# Patient Record
Sex: Male | Born: 1940 | Race: White | Hispanic: No | Marital: Married | Smoking: Former smoker
Health system: Southern US, Community
[De-identification: ages and names within clinical notes are randomized; demographics above are authoritative.]

## PROBLEM LIST (undated history)

## (undated) DIAGNOSIS — Z9981 Dependence on supplemental oxygen: Secondary | ICD-10-CM

## (undated) DIAGNOSIS — D040 Carcinoma in situ of skin of lip: Secondary | ICD-10-CM

## (undated) DIAGNOSIS — M21942 Unspecified acquired deformity of hand, left hand: Secondary | ICD-10-CM

## (undated) DIAGNOSIS — M109 Gout, unspecified: Secondary | ICD-10-CM

## (undated) DIAGNOSIS — J449 Chronic obstructive pulmonary disease, unspecified: Secondary | ICD-10-CM

## (undated) DIAGNOSIS — I10 Essential (primary) hypertension: Secondary | ICD-10-CM

## (undated) DIAGNOSIS — K219 Gastro-esophageal reflux disease without esophagitis: Secondary | ICD-10-CM

## (undated) DIAGNOSIS — E785 Hyperlipidemia, unspecified: Secondary | ICD-10-CM

## (undated) DIAGNOSIS — N400 Enlarged prostate without lower urinary tract symptoms: Secondary | ICD-10-CM

## (undated) DIAGNOSIS — C4492 Squamous cell carcinoma of skin, unspecified: Secondary | ICD-10-CM

## (undated) DIAGNOSIS — M1990 Unspecified osteoarthritis, unspecified site: Secondary | ICD-10-CM

## (undated) DIAGNOSIS — C439 Malignant melanoma of skin, unspecified: Secondary | ICD-10-CM

## (undated) DIAGNOSIS — N529 Male erectile dysfunction, unspecified: Secondary | ICD-10-CM

## (undated) DIAGNOSIS — H8109 Meniere's disease, unspecified ear: Secondary | ICD-10-CM

## (undated) DIAGNOSIS — D229 Melanocytic nevi, unspecified: Secondary | ICD-10-CM

## (undated) HISTORY — DX: Male erectile dysfunction, unspecified: N52.9

## (undated) HISTORY — PX: THUMB AMPUTATION: SHX804

## (undated) HISTORY — PX: TRANSURETHRAL RESECTION OF PROSTATE: SHX73

## (undated) HISTORY — DX: Meniere's disease, unspecified ear: H81.09

## (undated) HISTORY — DX: Unspecified osteoarthritis, unspecified site: M19.90

## (undated) HISTORY — DX: Unspecified acquired deformity of hand, left hand: M21.942

## (undated) HISTORY — PX: TONSILLECTOMY: SUR1361

## (undated) HISTORY — DX: Benign prostatic hyperplasia without lower urinary tract symptoms: N40.0

## (undated) HISTORY — DX: Gastro-esophageal reflux disease without esophagitis: K21.9

## (undated) HISTORY — DX: Hyperlipidemia, unspecified: E78.5

## (undated) HISTORY — DX: Chronic obstructive pulmonary disease, unspecified: J44.9

## (undated) HISTORY — PX: OTHER SURGICAL HISTORY: SHX169

## (undated) HISTORY — DX: Essential (primary) hypertension: I10

---
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
## 1998-03-31 DIAGNOSIS — D040 Carcinoma in situ of skin of lip: Secondary | ICD-10-CM

## 1998-03-31 HISTORY — DX: Carcinoma in situ of skin of lip: D04.0

## 2001-01-16 DIAGNOSIS — C4492 Squamous cell carcinoma of skin, unspecified: Secondary | ICD-10-CM

## 2001-01-16 HISTORY — DX: Squamous cell carcinoma of skin, unspecified: C44.92

## 2001-05-18 ENCOUNTER — Encounter: Admission: RE | Admit: 2001-05-18 | Discharge: 2001-05-18 | Payer: Self-pay | Admitting: Family Medicine

## 2001-05-18 ENCOUNTER — Encounter: Payer: Self-pay | Admitting: Family Medicine

## 2002-03-04 ENCOUNTER — Encounter: Payer: Self-pay | Admitting: Urology

## 2002-03-13 ENCOUNTER — Inpatient Hospital Stay (HOSPITAL_COMMUNITY): Admission: RE | Admit: 2002-03-13 | Discharge: 2002-03-15 | Payer: Self-pay | Admitting: Urology

## 2002-03-13 ENCOUNTER — Encounter: Payer: Self-pay | Admitting: Urology

## 2002-03-13 ENCOUNTER — Encounter (INDEPENDENT_AMBULATORY_CARE_PROVIDER_SITE_OTHER): Payer: Self-pay | Admitting: Specialist

## 2004-04-26 ENCOUNTER — Ambulatory Visit: Payer: Self-pay | Admitting: Family Medicine

## 2004-05-04 ENCOUNTER — Ambulatory Visit: Payer: Self-pay | Admitting: Family Medicine

## 2005-02-22 ENCOUNTER — Ambulatory Visit: Payer: Self-pay | Admitting: Family Medicine

## 2005-02-28 ENCOUNTER — Ambulatory Visit: Payer: Self-pay | Admitting: Internal Medicine

## 2005-02-28 ENCOUNTER — Encounter: Admission: RE | Admit: 2005-02-28 | Discharge: 2005-02-28 | Payer: Self-pay | Admitting: Family Medicine

## 2005-03-18 ENCOUNTER — Ambulatory Visit: Payer: Self-pay | Admitting: Internal Medicine

## 2005-03-24 ENCOUNTER — Ambulatory Visit: Payer: Self-pay

## 2005-04-18 ENCOUNTER — Ambulatory Visit (HOSPITAL_COMMUNITY): Admission: RE | Admit: 2005-04-18 | Discharge: 2005-04-18 | Payer: Self-pay | Admitting: Internal Medicine

## 2005-04-18 ENCOUNTER — Ambulatory Visit: Payer: Self-pay | Admitting: Internal Medicine

## 2005-04-28 ENCOUNTER — Ambulatory Visit: Payer: Self-pay | Admitting: Family Medicine

## 2005-05-06 ENCOUNTER — Ambulatory Visit: Payer: Self-pay | Admitting: Internal Medicine

## 2005-05-10 ENCOUNTER — Ambulatory Visit: Payer: Self-pay | Admitting: Family Medicine

## 2005-05-12 ENCOUNTER — Ambulatory Visit: Payer: Self-pay | Admitting: *Deleted

## 2005-05-18 ENCOUNTER — Ambulatory Visit: Payer: Self-pay

## 2005-05-18 ENCOUNTER — Encounter: Payer: Self-pay | Admitting: Cardiovascular Disease

## 2005-05-26 ENCOUNTER — Ambulatory Visit: Payer: Self-pay | Admitting: Emergency Medicine

## 2005-05-27 ENCOUNTER — Ambulatory Visit: Payer: Self-pay | Admitting: Emergency Medicine

## 2005-07-04 ENCOUNTER — Ambulatory Visit: Payer: Self-pay | Admitting: Emergency Medicine

## 2005-07-12 ENCOUNTER — Ambulatory Visit: Payer: Self-pay | Admitting: Family Medicine

## 2005-07-18 ENCOUNTER — Ambulatory Visit: Payer: Self-pay | Admitting: Gastroenterology

## 2005-07-28 ENCOUNTER — Ambulatory Visit (HOSPITAL_COMMUNITY): Admission: RE | Admit: 2005-07-28 | Discharge: 2005-07-28 | Payer: Self-pay | Admitting: Gastroenterology

## 2005-07-28 ENCOUNTER — Encounter (INDEPENDENT_AMBULATORY_CARE_PROVIDER_SITE_OTHER): Payer: Self-pay | Admitting: *Deleted

## 2005-07-28 ENCOUNTER — Ambulatory Visit: Payer: Self-pay | Admitting: Gastroenterology

## 2005-08-08 ENCOUNTER — Ambulatory Visit: Payer: Self-pay | Admitting: Family Medicine

## 2005-11-28 ENCOUNTER — Ambulatory Visit: Payer: Self-pay | Admitting: Emergency Medicine

## 2006-02-15 ENCOUNTER — Ambulatory Visit: Payer: Self-pay | Admitting: Emergency Medicine

## 2006-03-13 ENCOUNTER — Ambulatory Visit: Payer: Self-pay | Admitting: Family Medicine

## 2006-04-10 ENCOUNTER — Ambulatory Visit: Payer: Self-pay | Admitting: Emergency Medicine

## 2006-04-21 ENCOUNTER — Ambulatory Visit: Payer: Self-pay | Admitting: Family Medicine

## 2006-04-21 LAB — CONVERTED CEMR LAB
Albumin: 3.8 g/dL (ref 3.5–5.2)
Alkaline Phosphatase: 73 units/L (ref 39–117)
Basophils Absolute: 0.2 10*3/uL — ABNORMAL HIGH (ref 0.0–0.1)
CO2: 24 meq/L (ref 19–32)
Chol/HDL Ratio, serum: 5
Creatinine, Ser: 1.3 mg/dL (ref 0.4–1.5)
Glomerular Filtration Rate, Af Am: 71 mL/min/{1.73_m2}
Glucose, Bld: 108 mg/dL — ABNORMAL HIGH (ref 70–99)
Ketones, ur: NEGATIVE mg/dL
LDL DIRECT: 143.2 mg/dL
MCHC: 33.4 g/dL (ref 30.0–36.0)
Monocytes Relative: 5.9 % (ref 3.0–11.0)
Neutro Abs: 4.8 10*3/uL (ref 1.4–7.7)
Nitrite: NEGATIVE
Platelets: 287 10*3/uL (ref 150–400)
Potassium: 3.5 meq/L (ref 3.5–5.1)
RDW: 12.8 % (ref 11.5–14.6)
Specific Gravity, Urine: 1.01 (ref 1.000–1.03)
TSH: 1.21 microintl units/mL (ref 0.35–5.50)
Total Bilirubin: 1.1 mg/dL (ref 0.3–1.2)
Total Protein, Urine: NEGATIVE mg/dL
Total Protein: 6.8 g/dL (ref 6.0–8.3)
Urobilinogen, UA: 0.2 (ref 0.0–1.0)
VLDL: 38 mg/dL (ref 0–40)
pH: 5.5 (ref 5.0–8.0)

## 2006-05-02 ENCOUNTER — Ambulatory Visit: Payer: Self-pay | Admitting: Family Medicine

## 2006-06-07 ENCOUNTER — Ambulatory Visit: Payer: Self-pay | Admitting: Emergency Medicine

## 2006-12-08 ENCOUNTER — Ambulatory Visit: Payer: Self-pay | Admitting: Family Medicine

## 2006-12-08 DIAGNOSIS — N401 Enlarged prostate with lower urinary tract symptoms: Secondary | ICD-10-CM

## 2006-12-08 DIAGNOSIS — R351 Nocturia: Secondary | ICD-10-CM

## 2006-12-08 DIAGNOSIS — M1990 Unspecified osteoarthritis, unspecified site: Secondary | ICD-10-CM | POA: Insufficient documentation

## 2006-12-08 DIAGNOSIS — M79609 Pain in unspecified limb: Secondary | ICD-10-CM | POA: Insufficient documentation

## 2006-12-08 DIAGNOSIS — M66329 Spontaneous rupture of flexor tendons, unspecified upper arm: Secondary | ICD-10-CM

## 2006-12-08 DIAGNOSIS — J449 Chronic obstructive pulmonary disease, unspecified: Secondary | ICD-10-CM | POA: Insufficient documentation

## 2006-12-08 DIAGNOSIS — J309 Allergic rhinitis, unspecified: Secondary | ICD-10-CM

## 2006-12-08 DIAGNOSIS — I10 Essential (primary) hypertension: Secondary | ICD-10-CM

## 2006-12-08 DIAGNOSIS — K219 Gastro-esophageal reflux disease without esophagitis: Secondary | ICD-10-CM

## 2006-12-08 DIAGNOSIS — E785 Hyperlipidemia, unspecified: Secondary | ICD-10-CM

## 2006-12-28 ENCOUNTER — Ambulatory Visit: Payer: Self-pay | Admitting: Family Medicine

## 2006-12-28 ENCOUNTER — Telehealth (INDEPENDENT_AMBULATORY_CARE_PROVIDER_SITE_OTHER): Payer: Self-pay | Admitting: *Deleted

## 2006-12-28 LAB — CONVERTED CEMR LAB
CO2: 26 meq/L (ref 19–32)
Calcium: 9.3 mg/dL (ref 8.4–10.5)
Chloride: 104 meq/L (ref 96–112)
GFR calc non Af Amer: 71 mL/min
Glucose, Bld: 101 mg/dL — ABNORMAL HIGH (ref 70–99)
Sodium: 139 meq/L (ref 135–145)

## 2007-05-29 ENCOUNTER — Telehealth (INDEPENDENT_AMBULATORY_CARE_PROVIDER_SITE_OTHER): Payer: Self-pay | Admitting: *Deleted

## 2007-05-30 ENCOUNTER — Ambulatory Visit: Payer: Self-pay | Admitting: Family Medicine

## 2007-05-30 DIAGNOSIS — N529 Male erectile dysfunction, unspecified: Secondary | ICD-10-CM

## 2007-05-30 DIAGNOSIS — Z8739 Personal history of other diseases of the musculoskeletal system and connective tissue: Secondary | ICD-10-CM

## 2007-05-30 LAB — CONVERTED CEMR LAB
Albumin: 4 g/dL (ref 3.5–5.2)
Basophils Absolute: 0 10*3/uL (ref 0.0–0.1)
Bilirubin Urine: NEGATIVE
Bilirubin, Direct: 0.1 mg/dL (ref 0.0–0.3)
Chloride: 98 meq/L (ref 96–112)
Cholesterol: 190 mg/dL (ref 0–200)
Direct LDL: 121.9 mg/dL
Eosinophils Absolute: 0.3 10*3/uL (ref 0.0–0.6)
Eosinophils Relative: 2.9 % (ref 0.0–5.0)
GFR calc Af Amer: 71 mL/min
GFR calc non Af Amer: 59 mL/min
Glucose, Bld: 83 mg/dL (ref 70–99)
HDL: 36.3 mg/dL — ABNORMAL LOW (ref >39.0)
Hemoglobin: 15.6 g/dL (ref 13.0–17.0)
Ketones, urine, test strip: NEGATIVE
MCHC: 34.9 g/dL (ref 30.0–36.0)
Monocytes Absolute: 0.7 10*3/uL (ref 0.2–0.7)
Neutro Abs: 6.2 10*3/uL (ref 1.4–7.7)
Neutrophils Relative %: 67.2 % (ref 43.0–77.0)
Nitrite: NEGATIVE
PSA: 0.62 ng/mL (ref 0.10–4.00)
Potassium: 4 meq/L (ref 3.5–5.1)
Protein, U semiquant: NEGATIVE
RBC: 5.15 M/uL (ref 4.22–5.81)
Sodium: 137 meq/L (ref 135–145)
Total CHOL/HDL Ratio: 5.2
Urobilinogen, UA: NEGATIVE
VLDL: 42 mg/dL — ABNORMAL HIGH (ref 0–40)
WBC: 9.2 10*3/uL (ref 4.5–10.5)

## 2007-06-04 ENCOUNTER — Telehealth: Payer: Self-pay | Admitting: Family Medicine

## 2007-06-14 ENCOUNTER — Ambulatory Visit: Payer: Self-pay | Admitting: Emergency Medicine

## 2007-06-15 ENCOUNTER — Telehealth: Payer: Self-pay | Admitting: Family Medicine

## 2007-06-22 ENCOUNTER — Telehealth: Payer: Self-pay | Admitting: Family Medicine

## 2007-07-17 ENCOUNTER — Ambulatory Visit: Payer: Self-pay | Admitting: Family Medicine

## 2007-07-29 ENCOUNTER — Encounter: Payer: Self-pay | Admitting: Gastroenterology

## 2007-09-18 ENCOUNTER — Ambulatory Visit: Payer: Self-pay | Admitting: Family Medicine

## 2007-09-18 DIAGNOSIS — R05 Cough: Secondary | ICD-10-CM

## 2007-09-25 ENCOUNTER — Ambulatory Visit: Payer: Self-pay | Admitting: Emergency Medicine

## 2007-10-29 ENCOUNTER — Telehealth: Payer: Self-pay | Admitting: *Deleted

## 2007-11-05 ENCOUNTER — Telehealth (INDEPENDENT_AMBULATORY_CARE_PROVIDER_SITE_OTHER): Payer: Self-pay | Admitting: *Deleted

## 2007-12-03 ENCOUNTER — Ambulatory Visit: Payer: Self-pay | Admitting: Gastroenterology

## 2007-12-19 ENCOUNTER — Ambulatory Visit (HOSPITAL_COMMUNITY): Admission: RE | Admit: 2007-12-19 | Discharge: 2007-12-19 | Payer: Self-pay | Admitting: Gastroenterology

## 2007-12-19 ENCOUNTER — Telehealth (INDEPENDENT_AMBULATORY_CARE_PROVIDER_SITE_OTHER): Payer: Self-pay | Admitting: *Deleted

## 2007-12-20 ENCOUNTER — Encounter: Payer: Self-pay | Admitting: Gastroenterology

## 2007-12-20 ENCOUNTER — Ambulatory Visit (HOSPITAL_COMMUNITY): Admission: RE | Admit: 2007-12-20 | Discharge: 2007-12-20 | Payer: Self-pay | Admitting: Gastroenterology

## 2007-12-21 ENCOUNTER — Encounter: Payer: Self-pay | Admitting: Gastroenterology

## 2007-12-26 ENCOUNTER — Ambulatory Visit: Payer: Self-pay | Admitting: Gastroenterology

## 2008-03-17 ENCOUNTER — Telehealth: Payer: Self-pay | Admitting: Gastroenterology

## 2008-03-17 ENCOUNTER — Encounter: Payer: Self-pay | Admitting: Gastroenterology

## 2008-05-19 ENCOUNTER — Encounter: Payer: Self-pay | Admitting: Family Medicine

## 2008-05-19 ENCOUNTER — Encounter: Admission: RE | Admit: 2008-05-19 | Discharge: 2008-08-17 | Payer: Self-pay | Admitting: Family Medicine

## 2008-06-13 ENCOUNTER — Ambulatory Visit: Payer: Self-pay | Admitting: Family Medicine

## 2008-06-13 LAB — CONVERTED CEMR LAB
ALT: 19 units/L (ref 0–53)
AST: 37 units/L (ref 0–37)
Albumin: 4 g/dL (ref 3.5–5.2)
BUN: 24 mg/dL — ABNORMAL HIGH (ref 6–23)
Basophils Absolute: 0 10*3/uL (ref 0.0–0.1)
Basophils Relative: 0.1 % (ref 0.0–3.0)
Bilirubin Urine: NEGATIVE
CO2: 27 meq/L (ref 19–32)
Calcium: 9.2 mg/dL (ref 8.4–10.5)
Chloride: 102 meq/L (ref 96–112)
Cholesterol: 180 mg/dL (ref 0–200)
Creatinine, Ser: 1.3 mg/dL (ref 0.4–1.5)
Eosinophils Relative: 2.6 % (ref 0.0–5.0)
Glucose, Urine, Semiquant: NEGATIVE
Hemoglobin: 16.9 g/dL (ref 13.0–17.0)
LDL Cholesterol: 118 mg/dL — ABNORMAL HIGH (ref 0–99)
Lymphocytes Relative: 22.8 % (ref 12.0–46.0)
MCHC: 34.6 g/dL (ref 30.0–36.0)
MCV: 90.2 fL (ref 78.0–100.0)
Neutro Abs: 5.6 10*3/uL (ref 1.4–7.7)
Neutrophils Relative %: 66.3 % (ref 43.0–77.0)
PSA: 0.46 ng/mL (ref 0.10–4.00)
Protein, U semiquant: NEGATIVE
RBC: 5.41 M/uL (ref 4.22–5.81)
Total Protein: 7.1 g/dL (ref 6.0–8.3)
Uric Acid, Serum: 6.7 mg/dL (ref 4.0–7.8)
VLDL: 19 mg/dL (ref 0–40)
WBC Urine, dipstick: NEGATIVE
WBC: 8.4 10*3/uL (ref 4.5–10.5)
pH: 7

## 2008-07-02 ENCOUNTER — Telehealth: Payer: Self-pay | Admitting: Gastroenterology

## 2008-07-22 ENCOUNTER — Telehealth: Payer: Self-pay | Admitting: Family Medicine

## 2008-08-05 ENCOUNTER — Ambulatory Visit: Payer: Self-pay | Admitting: Gastroenterology

## 2008-09-16 ENCOUNTER — Ambulatory Visit: Payer: Self-pay | Admitting: Gastroenterology

## 2009-01-12 ENCOUNTER — Telehealth (INDEPENDENT_AMBULATORY_CARE_PROVIDER_SITE_OTHER): Payer: Self-pay | Admitting: *Deleted

## 2009-03-11 ENCOUNTER — Ambulatory Visit: Payer: Self-pay | Admitting: Emergency Medicine

## 2009-03-13 ENCOUNTER — Telehealth: Payer: Self-pay | Admitting: Adult Health

## 2009-03-18 ENCOUNTER — Encounter: Payer: Self-pay | Admitting: Gastroenterology

## 2009-03-19 ENCOUNTER — Telehealth: Payer: Self-pay | Admitting: Gastroenterology

## 2009-03-19 ENCOUNTER — Ambulatory Visit: Payer: Self-pay | Admitting: Family Medicine

## 2009-03-19 DIAGNOSIS — M545 Low back pain: Secondary | ICD-10-CM

## 2009-03-19 LAB — CONVERTED CEMR LAB
Nitrite: NEGATIVE
Protein, U semiquant: NEGATIVE
Urobilinogen, UA: 0.2
WBC Urine, dipstick: NEGATIVE

## 2009-03-20 ENCOUNTER — Telehealth: Payer: Self-pay | Admitting: Family Medicine

## 2009-04-13 ENCOUNTER — Ambulatory Visit: Payer: Self-pay | Admitting: Emergency Medicine

## 2009-04-16 ENCOUNTER — Telehealth: Payer: Self-pay | Admitting: Emergency Medicine

## 2009-04-30 ENCOUNTER — Encounter: Payer: Self-pay | Admitting: Emergency Medicine

## 2009-06-08 ENCOUNTER — Ambulatory Visit: Payer: Self-pay | Admitting: Family Medicine

## 2009-06-08 LAB — CONVERTED CEMR LAB
Bilirubin Urine: NEGATIVE
Blood in Urine, dipstick: NEGATIVE
Glucose, Urine, Semiquant: NEGATIVE
Ketones, urine, test strip: NEGATIVE
Nitrite: NEGATIVE
Protein, U semiquant: NEGATIVE
Specific Gravity, Urine: 1.015
Urobilinogen, UA: 0.2
WBC Urine, dipstick: NEGATIVE
pH: 7

## 2009-06-10 LAB — CONVERTED CEMR LAB
AST: 39 units/L — ABNORMAL HIGH (ref 0–37)
Alkaline Phosphatase: 74 units/L (ref 39–117)
BUN: 22 mg/dL (ref 6–23)
Basophils Absolute: 0 10*3/uL (ref 0.0–0.1)
Calcium: 9.1 mg/dL (ref 8.4–10.5)
GFR calc non Af Amer: 58.41 mL/min (ref >60)
Glucose, Bld: 111 mg/dL — ABNORMAL HIGH (ref 70–99)
HDL: 43.9 mg/dL (ref >39.00)
Hemoglobin: 15.3 g/dL (ref 13.0–17.0)
LDL Cholesterol: 99 mg/dL (ref 0–99)
Lymphocytes Relative: 22.9 % (ref 12.0–46.0)
Monocytes Relative: 8 % (ref 3.0–12.0)
Platelets: 214 10*3/uL (ref 150.0–400.0)
RDW: 12.7 % (ref 11.5–14.6)
Sodium: 141 meq/L (ref 135–145)
Testosterone: 499.63 ng/dL (ref 350.00–890.00)
Total Bilirubin: 1.2 mg/dL (ref 0.3–1.2)
VLDL: 25 mg/dL (ref 0.0–40.0)
WBC: 5.8 10*3/uL (ref 4.5–10.5)

## 2009-06-29 ENCOUNTER — Ambulatory Visit: Payer: Self-pay | Admitting: Emergency Medicine

## 2010-02-09 ENCOUNTER — Encounter: Payer: Self-pay | Admitting: Family Medicine

## 2010-03-29 ENCOUNTER — Encounter
Admission: RE | Admit: 2010-03-29 | Discharge: 2010-04-26 | Payer: Self-pay | Source: Home / Self Care | Attending: Podiatry | Admitting: Podiatry

## 2010-04-30 ENCOUNTER — Ambulatory Visit: Payer: Self-pay | Admitting: Cardiovascular Disease

## 2010-05-07 ENCOUNTER — Encounter: Payer: Self-pay | Admitting: Cardiovascular Disease

## 2010-05-07 DIAGNOSIS — R079 Chest pain, unspecified: Secondary | ICD-10-CM | POA: Insufficient documentation

## 2010-05-19 ENCOUNTER — Ambulatory Visit
Admission: RE | Admit: 2010-05-19 | Discharge: 2010-05-19 | Payer: Self-pay | Source: Home / Self Care | Attending: Sports Medicine | Admitting: Sports Medicine

## 2010-05-19 DIAGNOSIS — M7660 Achilles tendinitis, unspecified leg: Secondary | ICD-10-CM | POA: Insufficient documentation

## 2010-06-10 ENCOUNTER — Other Ambulatory Visit: Payer: Self-pay | Admitting: Family Medicine

## 2010-06-10 ENCOUNTER — Ambulatory Visit
Admission: RE | Admit: 2010-06-10 | Discharge: 2010-06-10 | Payer: Self-pay | Source: Home / Self Care | Attending: Family Medicine | Admitting: Family Medicine

## 2010-06-10 LAB — CBC WITH DIFFERENTIAL/PLATELET
Basophils Relative: 0.5 % (ref 0.0–3.0)
Eosinophils Relative: 3.3 % (ref 0.0–5.0)
MCV: 91.1 fl (ref 78.0–100.0)
Monocytes Absolute: 0.5 10*3/uL (ref 0.1–1.0)
Neutrophils Relative %: 65.4 % (ref 43.0–77.0)
RBC: 5.26 Mil/uL (ref 4.22–5.81)
WBC: 7.2 10*3/uL (ref 4.5–10.5)

## 2010-06-10 LAB — HEPATIC FUNCTION PANEL
ALT: 16 U/L (ref 0–53)
AST: 32 U/L (ref 0–37)
Alkaline Phosphatase: 81 U/L (ref 39–117)
Bilirubin, Direct: 0.1 mg/dL (ref 0.0–0.3)
Total Protein: 6.6 g/dL (ref 6.0–8.3)

## 2010-06-10 LAB — CONVERTED CEMR LAB
Ketones, urine, test strip: NEGATIVE
Nitrite: NEGATIVE
Urobilinogen, UA: 0.2

## 2010-06-10 LAB — LIPID PANEL
LDL Cholesterol: 125 mg/dL — ABNORMAL HIGH (ref 0–99)
Total CHOL/HDL Ratio: 5
Triglycerides: 157 mg/dL — ABNORMAL HIGH (ref 0.0–149.0)

## 2010-06-10 LAB — PSA: PSA: 0.5 ng/mL (ref 0.10–4.00)

## 2010-06-10 LAB — BASIC METABOLIC PANEL
Chloride: 101 mEq/L (ref 96–112)
Creatinine, Ser: 1.3 mg/dL (ref 0.4–1.5)
Potassium: 4.1 mEq/L (ref 3.5–5.1)

## 2010-06-15 NOTE — Assessment & Plan Note (Signed)
Summary: emp-will fast//ccm/dr office rescd per pt//ccm   Vital Signs:  Patient profile:   70 year old male Height:      70 inches Weight:      196 pounds Temp:     98.5 degrees F oral BP sitting:   110 / 76  (left arm) Cuff size:   regular  Vitals Entered By: Kern Reap CMA Duncan Dull) (June 08, 2009 9:01 AM)  Reason for Visit cpx  Primary Care Provider:  Kelle Darting, MD   History of Present Illness: [name] is a 70 year old, married male, retired Ph.D., who comes in today for evaluation of multiple problems.  He has underlying hypertension, for which he takes Tenormin, 50 mg daily, Maxzide 75 -- 50 daily, Norvasc 7.5 mg daily, and was started 25 mg daily, and potassium 20 mEq daily.  BP 110/76.  He also has chronic back pain for which he takes Flexeril, 10 mg nightly  He also has hyperlipidemia, for which he takes Lipitor 40 mg nightly Will check lipid panel today.  He has a history of gout, for which he takes allopurinol 150 mg nightly.  No attacks this year.  He also has severe osteoarthritis, for which he takes Relafen, 500 mg b.i.d.  He also has underlying COPD, for which he takes Spiriva one puff daily prior p.r.n.  He also has erectile dysfunction.  He's tried all the medications none seem to work.  We will check a testosterone level today.  He also has reflux esophagitis, for which he takes 40 mg of Protonix b.i.d.  He also has mild sleep dysfunction, for which he takes Elavil 25 mg nightly.  He takes over-the-counter saw palmetto for BPH.  He also uses Flonase nasal spray, and over-the-counter Zyrtec for allergic rhinitis.    Allergies: 1)  ! Codeine  Past History:  Past medical, surgical, family and social histories (including risk factors) reviewed, and no changes noted (except as noted below).  Past Medical History: Reviewed history from 05/30/2007 and no changes required. GERD Hyperlipidemia Hypertension Allergic rhinitis Benign prostatic  hypertrophy COPD Osteoarthritis-DJD [name] BPH double collecting system renal right inguinal hernia repair tonsillectomy.  Deformity, left hand, secondary to prior drug abuse  Past Surgical History: Reviewed history from 12/08/2006 and no changes required. Transurethral resection of prostate  Family History: Reviewed history from 05/30/2007 and no changes required. father retired, M.D., in [place], [place], had gallbladder disease, and colon polyps.  Mother died of an MI.  She had arthritis, hypertension, smoker, and peripheral vascular disease.  One brother in good health two sisters one has arthritis.  Social History: Reviewed history from 03/11/2009 and no changes required. 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  Quit in 2007. Alcohol use-no Drug use-no Regular exercise-yes  Review of Systems      See HPI  Physical Exam  General:  Well-developed,well-nourished,in no acute distress; alert,appropriate and cooperative throughout examination Head:  Normocephalic and atraumatic without obvious abnormalities. No apparent alopecia or balding. Eyes:  No corneal or conjunctival inflammation noted. EOMI. Perrla. Funduscopic exam benign, without hemorrhages, exudates or papilledema. Vision grossly normal. Ears:  External ear exam shows no significant lesions or deformities.  Otoscopic examination reveals clear canals, tympanic membranes are intact bilaterally without bulging, retraction, inflammation or discharge. Hearing is grossly normal bilaterally. Nose:  External nasal examination shows no deformity or inflammation. Nasal mucosa are pink and moist without lesions or exudates. Mouth:  Oral  mucosa and oropharynx without lesions or exudates.  Teeth in good repair. Neck:  No deformities, masses, or tenderness noted. Chest Wall:  No deformities, masses, tenderness or gynecomastia noted. Breasts:   No masses or gynecomastia noted Lungs:  symmetrical breath sounds decreased, consistent with COPD Heart:  Normal rate and regular rhythm. S1 and S2 normal without gallop, murmur, click, rub or other extra sounds........Marland Kitchenheart sounds distant, consistent with COPD Abdomen:  Bowel sounds positive,abdomen soft and non-tender without masses, organomegaly or hernias noted. Rectal:  No external abnormalities noted. Normal sphincter tone. No rectal masses or tenderness. Genitalia:  Testes bilaterally descended without nodularity, tenderness or masses. No scrotal masses or lesions. No penis lesions or urethral discharge. Prostate:  Prostate gland firm and smooth, no enlargement, nodularity, tenderness, mass, asymmetry or induration. 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   Neurologic:  No cranial nerve deficits noted. Station and gait are normal. Plantar reflexes are down-going bilaterally. DTRs are symmetrical throughout. Sensory, motor and coordinative functions appear intact. Skin:  Intact without suspicious lesions or rashes.........onychomycosis of toenails Cervical Nodes:  No lymphadenopathy noted Axillary Nodes:  No palpable lymphadenopathy Inguinal Nodes:  No significant adenopathy Psych:  Cognition and judgment appear intact. Alert and cooperative with normal attention span and concentration. No apparent delusions, illusions, hallucinations   Impression & Recommendations:  Problem # 1:  ERECTILE DYSFUNCTION, ORGANIC (ICD-607.84) Assessment Deteriorated  His updated medication list for this problem includes:    Levitra 20 Mg Tabs (Vardenafil hcl) ..... Uad  Orders: Venipuncture (60454) TLB-Lipid Panel (80061-LIPID) TLB-BMP (Basic Metabolic Panel-BMET)  (80048-METABOL) TLB-CBC Platelet - w/Differential (85025-CBCD) TLB-Hepatic/Liver Function Pnl (80076-HEPATIC) TLB-TSH (Thyroid Stimulating Hormone) (84443-TSH) TLB-Uric Acid, Blood (84550-URIC) TLB-PSA (Prostate Specific Antigen) (84153-PSA) TLB-Testosterone, Total (84403-TESTO)  Problem # 2:  GOUTY TOPHI OF OTHER SITES (ICD-274.82) Assessment: Unchanged  His updated medication list for this problem includes:    Allopurinol 300 Mg Tabs (Allopurinol) .Marland Kitchen... 1/2 tab once daily    Colchicine 0.6 Mg Tabs (Colchicine) .Marland Kitchen... 1 q 6h. as needed gout  Orders: Venipuncture (09811) TLB-Lipid Panel (80061-LIPID) TLB-BMP (Basic Metabolic Panel-BMET) (80048-METABOL) TLB-CBC Platelet - w/Differential (85025-CBCD) TLB-Hepatic/Liver Function Pnl (80076-HEPATIC) TLB-TSH (Thyroid Stimulating Hormone) (84443-TSH) TLB-Uric Acid, Blood (84550-URIC) TLB-PSA (Prostate Specific Antigen) (84153-PSA) TLB-Testosterone, Total (84403-TESTO)  Problem # 3:  OSTEOARTHRITIS (ICD-715.90) Assessment: Unchanged  Orders: Venipuncture (91478) TLB-Lipid Panel (80061-LIPID) TLB-BMP (Basic Metabolic Panel-BMET) (80048-METABOL) TLB-CBC Platelet - w/Differential (85025-CBCD) TLB-Hepatic/Liver Function Pnl (80076-HEPATIC) TLB-TSH (Thyroid Stimulating Hormone) (84443-TSH) TLB-Uric Acid, Blood (84550-URIC) TLB-PSA (Prostate Specific Antigen) (84153-PSA) TLB-Testosterone, Total (84403-TESTO)  Problem # 4:  COPD (ICD-496) Assessment: Unchanged  His updated medication list for this problem includes:    Proair Hfa 108 (90 Base) Mcg/act Aers (Albuterol sulfate) .Marland Kitchen... Take one to two puffs as needed    Spiriva Handihaler 18 Mcg Caps (Tiotropium bromide monohydrate) ..... Use once daily  Orders: Venipuncture (29562) TLB-Lipid Panel (80061-LIPID) TLB-BMP (Basic Metabolic Panel-BMET) (80048-METABOL) TLB-CBC Platelet - w/Differential (85025-CBCD) TLB-Hepatic/Liver Function Pnl (80076-HEPATIC) TLB-TSH (Thyroid Stimulating  Hormone) (84443-TSH) TLB-Uric Acid, Blood (84550-URIC) TLB-PSA (Prostate Specific Antigen) (84153-PSA) TLB-Testosterone, Total (84403-TESTO)  Problem # 5:  HYPERTENSION (ICD-401.9) Assessment: Improved  The following medications were removed from the medication list:    Norvasc 5 Mg Tabs (Amlodipine besylate) .Marland Kitchen... 1 tab once daily His updated medication list for this  problem includes:    Tenormin 50 Mg Tabs (Atenolol) ..... Qam    Losartan Potassium 25 Mg Tabs (Losartan potassium) .Marland Kitchen... 1 by mouth once daily    Maxzide 75-50 Mg Tabs (Triamterene-hctz) ..... Qam    Norvasc 5 Mg Tabs (Amlodipine besylate) .Marland Kitchen... Take one and half tab once daily  Orders: Venipuncture (42353) TLB-Lipid Panel (80061-LIPID) TLB-BMP (Basic Metabolic Panel-BMET) (80048-METABOL) TLB-CBC Platelet - w/Differential (85025-CBCD) TLB-Hepatic/Liver Function Pnl (80076-HEPATIC) TLB-TSH (Thyroid Stimulating Hormone) (84443-TSH) TLB-Uric Acid, Blood (84550-URIC) TLB-PSA (Prostate Specific Antigen) (84153-PSA) TLB-Testosterone, Total (84403-TESTO) Prescription Created Electronically [phone]) UA Dipstick w/o Micro (automated)  (81003)  Problem # 6:  HYPERLIPIDEMIA (ICD-272.4) Assessment: Improved  His updated medication list for this problem includes:    Lipitor 40 Mg Tabs (Atorvastatin calcium) .Marland Kitchen... 1 tab @ bedtime  Orders: Venipuncture (15400) TLB-Lipid Panel (80061-LIPID) TLB-BMP (Basic Metabolic Panel-BMET) (80048-METABOL) TLB-CBC Platelet - w/Differential (85025-CBCD) TLB-Hepatic/Liver Function Pnl (80076-HEPATIC) TLB-TSH (Thyroid Stimulating Hormone) (84443-TSH) TLB-Uric Acid, Blood (84550-URIC) TLB-PSA (Prostate Specific Antigen) (84153-PSA) TLB-Testosterone, Total (84403-TESTO)  Problem # 7:  GERD (ICD-530.81) Assessment: Improved  His updated medication list for this problem includes:    Protonix 40 Mg Tbec (Pantoprazole sodium) .Marland Kitchen... 1 pill twice daily (20-30 min prior to breakfast and dinner  meals)  Orders: Venipuncture (86761) TLB-Lipid Panel (80061-LIPID) TLB-BMP (Basic Metabolic Panel-BMET) (80048-METABOL) TLB-CBC Platelet - w/Differential (85025-CBCD) TLB-Hepatic/Liver Function Pnl (80076-HEPATIC) TLB-TSH (Thyroid Stimulating Hormone) (84443-TSH) TLB-Uric Acid, Blood (84550-URIC) TLB-PSA (Prostate Specific Antigen) (84153-PSA) TLB-Testosterone, Total (84403-TESTO)  Complete Medication List: 1)  Nizoral 200 Mg Tabs (Ketoconazole) .... As needed 2)  Tenormin 50 Mg Tabs (Atenolol) .... Qam 3)  Losartan Potassium 25 Mg Tabs (Losartan potassium) .Marland Kitchen.. 1 by mouth once daily 4)  Maxzide 75-50 Mg Tabs (Triamterene-hctz) .... Qam 5)  Flexeril 10 Mg Tabs (Cyclobenzaprine hcl) .... Qhs 6)  Relafen 500  .... Take 1 tablet by mouth two times a day 7)  Flonase 50 Mcg/act Susp (Fluticasone propionate) .... 2 puffs qd 8)  Zyrtec Allergy 10 Mg Tabs (Cetirizine hcl) .... Once daily 9)  Allopurinol 300 Mg Tabs (Allopurinol) .... 1/2 tab once daily 10)  Proair Hfa 108 (90 Base) Mcg/act Aers (Albuterol sulfate) .... Take one to two puffs as needed 11)  Colchicine 0.6 Mg Tabs (Colchicine) .Marland Kitchen.. 1 q 6h. as needed gout 12)  Lipitor 40 Mg Tabs (Atorvastatin calcium) .Marland Kitchen.. 1 tab @ bedtime 13)  Protonix 40 Mg Tbec (Pantoprazole sodium) .Marland Kitchen.. 1 pill twice daily (20-30 min prior to breakfast and dinner meals) 14)  Saw Palmetto 1000 Mg Caps (Saw palmetto (serenoa repens)) .... One tab two times a day 15)  Glucosamine-chondroitin 500-400 Mg Caps (Glucosamine-chondroitin) .... Take one tab three times a day 16)  Levitra 20 Mg Tabs (Vardenafil hcl) .... Uad 17)  Spiriva Handihaler 18 Mcg Caps (Tiotropium bromide monohydrate) .... Use once daily 18)  Potassium Chloride Crys Cr 20 Meq Cr-tabs (Potassium chloride crys cr) .... Take 1 tablet by mouth every morning 19)  Amitriptyline Hcl 25 Mg Tabs (Amitriptyline hcl) .... 1/2 tab at bedtime for sleep 20)  Norvasc 5 Mg Tabs (Amlodipine besylate) .... Take  one and half tab once daily  Patient Instructions: 1)  Please schedule a follow-up appointment in 1 year. Prescriptions: FLEXERIL 10 MG  TABS (CYCLOBENZAPRINE HCL) QHS  #100 x 4   Entered and Authorized by:   Roderick Pee MD   Signed by:   Roderick Pee MD on 06/08/2009   Method used:   Print then Give to Patient  RxID:   [id number] NORVASC 5 MG TABS (AMLODIPINE BESYLATE) take one and half tab once daily  #150 x 3   Entered and Authorized by:   Roderick Pee MD   Signed by:   Roderick Pee MD on 06/08/2009   Method used:   Electronically to        [facility]. [address] [phone]* (retail)       [address] [place], [place]  [address]       Ph: [phone]       Fax: [phone]   RxID:   [id number] AMITRIPTYLINE HCL 25 MG TABS (AMITRIPTYLINE HCL) 1/2 tab at bedtime for sleep  #100 x 4   Entered and Authorized by:   Roderick Pee MD   Signed by:   Roderick Pee MD on 06/08/2009   Method used:   Electronically to        [facility]. [address] [phone]* (retail)       [address] [place], [place]  [address]       Ph: [phone]       Fax: [phone]   RxID:   [id number] POTASSIUM CHLORIDE CRYS CR 20 MEQ CR-TABS (POTASSIUM CHLORIDE CRYS CR) Take 1 tablet by mouth every morning  #100 x 4   Entered and Authorized by:   Roderick Pee MD   Signed by:   Roderick Pee MD on 06/08/2009   Method used:   Electronically to        [facility]. [address] [phone]* (retail)       [address] [place], [place]  [address]       Ph: [phone]       Fax: [phone]   RxID:   [id number] SPIRIVA HANDIHALER 18 MCG CAPS (TIOTROPIUM BROMIDE MONOHYDRATE) use once daily  #3 x 4   Entered and Authorized by:   Roderick Pee MD   Signed by:   Roderick Pee MD on 06/08/2009   Method used:   Electronically to        [facility]. [address] [phone]* (retail)       [address] [place], [place]  [address]       Ph: [phone]        Fax: [phone]   RxID:   [id number] LEVITRA 20 MG TABS (VARDENAFIL HCL) UAD  #6 x 11   Entered and Authorized by:   Roderick Pee MD   Signed by:   Roderick Pee MD on 06/08/2009   Method used:   Electronically to        [facility]. [address] [phone]* (retail)       [address] [place], [place]  [address]       Ph: [phone]       Fax: [phone]   RxID:   [id number] PROTONIX 40 MG  TBEC (PANTOPRAZOLE SODIUM) 1 pill twice daily (20-30 min prior to breakfast and dinner meals)  #200 x 4   Entered and Authorized by:   Roderick Pee MD   Signed by:   Roderick Pee MD on 06/08/2009   Method used:   Electronically to        [facility]. [employer] [phone]* (retail)  [address] [place], [place]  [address]       Ph: [phone]       Fax: [phone]   RxID:   [id number] LIPITOR 40 MG  TABS (ATORVASTATIN CALCIUM) 1 tab @ bedtime  #100 x 4   Entered and Authorized by:   Roderick Pee MD   Signed by:   Roderick Pee MD on 06/08/2009   Method used:   Electronically to        [facility]. [address] [phone]* (retail)       [address] [place], [place]  [address]       Ph: [phone]       Fax: [phone]   RxID:   [id number] COLCHICINE 0.6 MG  TABS (COLCHICINE) 1 q 6h. as needed gout  #30 x 2   Entered and Authorized by:   Roderick Pee MD   Signed by:   Roderick Pee MD on 06/08/2009   Method used:   Electronically to        [facility]. [address] [phone]* (retail)       [address] [place], [place]  [address]       Ph: [phone]       Fax: [phone]   RxID:   [id number] PROAIR HFA 108 (90 BASE) MCG/ACT  AERS (ALBUTEROL SULFATE) take one to two puffs as needed  #2 x 1   Entered and Authorized by:   Roderick Pee MD   Signed by:   Roderick Pee MD on 06/08/2009   Method used:   Electronically to        [facility]. [address] [phone]* (retail)       [address] [place], [place]  [address]       Ph: [phone]       Fax: [phone]   RxID:   [id number] ALLOPURINOL 300 MG  TABS (ALLOPURINOL) 1/2 tab once daily  #100 x 4   Entered and Authorized by:   Roderick Pee MD   Signed by:   Roderick Pee MD on 06/08/2009   Method used:   Electronically to        [facility]. [address] [phone]* (retail)       [address] [place], [place]  [address]       Ph: [phone]       Fax: [phone]   RxID:   [id number] FLONASE 50 MCG/ACT  SUSP (FLUTICASONE PROPIONATE) 2 PUFFS QD  #3 x 4   Entered and Authorized by:   Roderick Pee MD   Signed by:   Roderick Pee MD on 06/08/2009   Method used:   Electronically to        [facility]. [address] [phone]* (retail)       [address] [place], [place]  [address]       Ph: [phone]       Fax: [phone]   RxID:   [id number] RELAFEN 500 Take 1 tablet by mouth two times a day  #200 x 4   Entered and Authorized by:   Roderick Pee MD   Signed by:   Roderick Pee MD on 06/08/2009   Method used:  Print then Give to Patient   RxID:   [id number] MAXZIDE 75-50 MG  TABS (TRIAMTERENE-HCTZ) QAM  #100 x 4   Entered and Authorized by:   Roderick Pee MD   Signed by:   Roderick Pee MD on 06/08/2009   Method used:   Electronically to        [facility]. [address] [phone]* (retail)       [address] [place], [place]  [address]       Ph: [phone]       Fax: [phone]   RxID:   [id number] LOSARTAN POTASSIUM 25 MG TABS (LOSARTAN POTASSIUM) 1 by mouth once daily  #100 x 4   Entered and Authorized by:   Roderick Pee MD   Signed by:   Roderick Pee MD on 06/08/2009   Method used:   Electronically to        [facility]. [address] [phone]* (retail)       [address] [place], [place]  [address]       Ph: [phone]       Fax: [phone]   RxID:   [id number] TENORMIN 50 MG  TABS (ATENOLOL) QAM  #100 x 4   Entered and Authorized  by:   Roderick Pee MD   Signed by:   Roderick Pee MD on 06/08/2009   Method used:   Electronically to        [facility]. [address] [phone]* (retail)       [address] [place], [place]  [address]       Ph: [phone]       Fax: [phone]   RxID:   [id number]     Laboratory Results   Urine Tests    Routine Urinalysis   Color: yellow Appearance: Clear Glucose: negative   (Normal Range: Negative) Bilirubin: negative   (Normal Range: Negative) Ketone: negative   (Normal Range: Negative) Spec. Gravity: 1.015   (Normal Range: 1.003-1.035) Blood: negative   (Normal Range: Negative) pH: 7.0   (Normal Range: 5.0-8.0) Protein: negative   (Normal Range: Negative) Urobilinogen: 0.2   (Normal Range: 0-1) Nitrite: negative   (Normal Range: Negative) Leukocyte Esterace: negative   (Normal Range: Negative)    Comments: Rita Ohara  June 08, 2009 1:35 PM

## 2010-06-15 NOTE — Medication Information (Signed)
Summary: [facility]  [facility]   Imported By: Lester Holland Patent 07/09/2009 08:38:15  _____________________________________________________________________  External Attachment:    Type:   Image     Comment:   External Document

## 2010-06-15 NOTE — Assessment & Plan Note (Signed)
Summary: COPD   Visit Type:  Follow-up Primary Provider/Referring Provider:  Kelle Darting, MD  CC:  3 mo follow-up for COPD. The patient states his breathing is no better or worse. He does c/o a productive sounding cough but is unable to get any mucus up. He also c/o some sinus pain and pressure x1 week.Marland Kitchen  History of Present Illness: 70 yo man with COPD, allergic rhinitis, GERD.    March 11, 2009--Presents for an Acute Visit.  Pt c/o chest congestion, wheezing, dry cough, inc SOB .Symptoms started 1 month ago, now worse this past week. Minimally productive cough, has several coughing fits. Wheezing is getting worse. gerd worse over last week. Severe cough at times. Denies chest pain,  orthopnea, hemoptysis, fever, n/v/d, edema, headache.   ROV 04/13/09 -- was seen a month ago with cough, was changed from altase to benicar. Adjusted by Dr Tawanna Cooler - now on Benicar 5mg  + norvasc 5mg . Was given prednisone taper at that visit too, but didn't take it because it usually gives him side effects. He is still able to exert, but has noticed a difference in his overall fxn.   ROV 06/29/09 -- regular f/u for COPD. Has had some URI signs last several days. Having cough, scant sputum. Some nasal congestion. Not really manifesting itself as a change in his breathing, wheeze. Has been using mucinex. Overall breathing has been stable, he doesn't feels that he needs any addition to his maintanance regimen.   Current Medications (verified): 1)  Nizoral 200 Mg Tabs (Ketoconazole) .... As Needed 2)  Tenormin 50 Mg  Tabs (Atenolol) .... Qam 3)  Losartan Potassium 25 Mg Tabs (Losartan Potassium) .Marland Kitchen.. 1 By Mouth Once Daily 4)  Maxzide 75-50 Mg  Tabs (Triamterene-Hctz) .... Qam 5)  Flexeril 10 Mg  Tabs (Cyclobenzaprine Hcl) .... Qhs 6)  Relafen 500 .... Take 1 Tablet By Mouth Two Times A Day 7)  Flonase 50 Mcg/act  Susp (Fluticasone Propionate) .... 2 Puffs Qd 8)  Zyrtec Allergy 10 Mg Tabs (Cetirizine Hcl) .... Once  Daily 9)  Allopurinol 300 Mg  Tabs (Allopurinol) .... 1/2 Tab Once Daily 10)  Proair Hfa 108 (90 Base) Mcg/act  Aers (Albuterol Sulfate) .... Take One To Two Puffs As Needed 11)  Colchicine 0.6 Mg  Tabs (Colchicine) .Marland Kitchen.. 1 Q 6h. As Needed Gout 12)  Lipitor 40 Mg  Tabs (Atorvastatin Calcium) .Marland Kitchen.. 1 Tab @ Bedtime 13)  Protonix 40 Mg  Tbec (Pantoprazole Sodium) .Marland Kitchen.. 1 Pill Twice Daily (20-30 Min Prior To Breakfast and Dinner Meals) 14)  Saw Palmetto 1000 Mg Caps (Saw Palmetto (Serenoa Repens)) .... One Tab Two Times A Day 15)  Glucosamine-Chondroitin 500-400 Mg Caps (Glucosamine-Chondroitin) .... Take One Tab Three Times A Day 16)  Levitra 20 Mg Tabs (Vardenafil Hcl) .... Uad 17)  Spiriva Handihaler 18 Mcg Caps (Tiotropium Bromide Monohydrate) .... Use Once Daily 18)  Potassium Chloride Crys Cr 20 Meq Cr-Tabs (Potassium Chloride Crys Cr) .... Take 1 Tablet By Mouth Every Morning 19)  Amitriptyline Hcl 25 Mg Tabs (Amitriptyline Hcl) .... 1/2 Tab At Bedtime For Sleep 20)  Norvasc 5 Mg Tabs (Amlodipine Besylate) .... Take One and Half Tab Once Daily  Allergies (verified): 1)  ! Codeine  Vital Signs:  Patient profile:   70 year old male Height:      70 inches (177.80 cm) Weight:      195.38 pounds (88.81 kg) BMI:     28.14 O2 Sat:      94 %  on Room air Temp:     97.7 degrees F (36.50 degrees C) oral Pulse rate:   74 / minute BP sitting:   118 / 84  (left arm) Cuff size:   regular  Vitals Entered By: Michel Bickers CMA (June 29, 2009 8:50 AM)  O2 Sat at Rest %:  94 O2 Flow:  Room air  Physical Exam  Additional Exam:  GEN: A/Ox3; pleasant , NAD HEENT:  [place]/AT, , EACs-clear, TMs-wnl, NOSE-clear, THROAT-clear NECK:  Supple w/ fair ROM; no JVD; normal carotid impulses w/o bruits; no thyromegaly or nodules palpated; no lymphadenopathy. RESP  distant no wheeze CARD:  RRR, no m/r/g   GI:   Not examined [place]: Warm bil,  no calf tenderness edema, clubbing, pulses intact Neuro: intact w/ no  focal deficits    Impression & Recommendations:  Problem # 1:  COPD (ICD-496) Continue spiriva + SABA rov 4 mo reviewed signs of exacerbation with him, he will call if symptoms change  Other Orders: Est. Patient Level III (21308)  Patient Instructions: 1)  Continue your Spiriva as you are using it.  2)  Use ProAir as needed  3)  Try to use OTC decongestants that contain either chlorphenerimine or bromphenerimine.  4)  Call our office if you develop fevers, more cough, change in sputum production or color, more wheze or shortness of breath.  5)  Follow up with Dr Delton Coombes in 4 months or as needed

## 2010-06-15 NOTE — Progress Notes (Signed)
Summary: PROBS W- MEDS   Phone Note Call from Patient Call back at Home Phone [phone]   Caller: Patient Call For: [name]  Reason for Call: Talk to Nurse, Referral Details for Reason: PROBS MEDS Summary of Call: pt believes that Protonix is not working too well anymore Initial call taken by: Guadlupe Spanish [facility],  July 02, 2008 9:20 AM  Follow-up for Phone Call        Taking protonix in am and it works well until around supper then starts getting reflux and has to take Gaviscon frequently.Some reflux at night. Follow-up by: Teryl Lucy RN,  July 02, 2008 9:37 AM  Additional Follow-up for Phone Call Additional follow up Details #1::        instead of gaviscon, I would like him to take OTC pepcid with his dinner meal.  Should have rov with me in 4-5 weeks. Additional Follow-up by: Rachael Fee MD,  July 02, 2008 10:28 AM    Additional Follow-up for Phone Call Additional follow up Details #2::    Pt. ntfd. of Dr.Jyllian Haynie orders and given rov appt. 08/05/08 at 8:30 Follow-up by: Teryl Lucy RN,  July 02, 2008 11:01 AM

## 2010-06-15 NOTE — Miscellaneous (Signed)
Summary: flu vaccine   Clinical Lists Changes  Observations: Added new observation of FLU VAX: Historical (12/08/2009 11:37)      Immunization History:  Influenza Immunization History:    Influenza:  historical (12/08/2009)

## 2010-06-16 ENCOUNTER — Encounter: Payer: Self-pay | Admitting: Sports Medicine

## 2010-06-16 ENCOUNTER — Ambulatory Visit: Admit: 2010-06-16 | Payer: Self-pay | Admitting: Sports Medicine

## 2010-06-16 ENCOUNTER — Ambulatory Visit: Payer: Medicare Other | Admitting: Sports Medicine

## 2010-06-16 DIAGNOSIS — M7660 Achilles tendinitis, unspecified leg: Secondary | ICD-10-CM

## 2010-06-17 NOTE — Assessment & Plan Note (Signed)
Summary: CPX (PT WILL COME IN FASTING) // RS   Vital Signs:  Patient profile:   70 year old male Height:      70 inches Weight:      196 pounds Temp:     98.0 degrees F oral BP sitting:   130 / 80  (left arm) Cuff size:   regular  Vitals Entered By: Kern Reap CMA Duncan Dull) (June 10, 2010 8:24 AM) CC: wllness exam Is Patient Diabetic? No   Primary Care Provider:  Kelle Darting, MD  CC:  wllness exam.  History of Present Illness: ed is a 70 year old, married male, nonsmoker, who comes in today for Medicare wellness examination because of a history of hypertension, hyperlipidemia, gout, COPD, reflux esophagitis.  Erectile dysfunction sleep dysfunction.  All his medications were reviewed in detail.  There been no changes except recently, the orthopedist has given him nitroglycerin glycerin patches for tendinitis of his heel.  Review of systems negative except he had a cardiac evaluation by one of our cardiologists in [place].  Because of chest pain.  His chest pain is a dull pain that comes and goes.  It is not associated with exertion.  It occurs in the left upper chest wall area and is relieved by Motrin.  No cardiac or pulmonary symptoms.  He gets routine eye care, dental care, colonoscopy, 2007, tetanus, 2007, seasonal flu 2011, Pneumovax 2006, information given on shingles.  Allergies: 1)  ! Codeine  Past History:  Past medical, surgical, family and social histories (including risk factors) reviewed, and no changes noted (except as noted below).  Past Medical History: Reviewed history from 05/30/2007 and no changes required. GERD Hyperlipidemia Hypertension Allergic rhinitis Benign prostatic hypertrophy COPD Osteoarthritis-DJD ED BPH double collecting system renal right inguinal hernia repair tonsillectomy.  Deformity, left hand, secondary to prior drug abuse  Past Surgical History: Reviewed history from 12/08/2006 and no changes required. Transurethral  resection of prostate  Family History: Reviewed history from 05/30/2007 and no changes required. father retired, M.D., in [place], [place], had gallbladder disease, and colon polyps.  Mother died of an MI.  She had arthritis, hypertension, smoker, and peripheral vascular disease.  One brother in good health two sisters one has arthritis.  Social History: Reviewed history from 03/11/2009 and no changes required. 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  Quit in 2007. Alcohol use-no Drug use-no Regular exercise-yes  Review of Systems      See HPI  Physical Exam  General:  Well-developed,well-nourished,in no acute distress; alert,appropriate and cooperative throughout examination Head:  Normocephalic and atraumatic without obvious abnormalities. No apparent alopecia or balding. Eyes:  No corneal or conjunctival inflammation noted. EOMI. Perrla. Funduscopic exam benign, without hemorrhages, exudates or papilledema. Vision grossly normal. Ears:  External ear exam shows no significant lesions or deformities.  Otoscopic examination reveals clear canals, tympanic membranes are intact bilaterally without bulging, retraction, inflammation or discharge. Hearing is grossly normal bilaterally. Nose:  External nasal examination shows no deformity or inflammation. Nasal mucosa are pink and moist without lesions or exudates. Mouth:  Oral mucosa and oropharynx without lesions or exudates.  Teeth in good repair. Neck:  No deformities, masses, or tenderness noted. Chest Wall:  No deformities, masses, tenderness or gynecomastia noted. Breasts:  No masses or gynecomastia noted Lungs:   decrease it and breast sounds.......symmetrical Heart:  Normal rate and regular rhythm. S1 and S2 normal without gallop, murmur, click, rub  or other extra sounds. Abdomen:  Bowel sounds positive,abdomen soft and non-tender without  masses, organomegaly or hernias noted. Rectal:  No external abnormalities noted. Normal sphincter tone. No rectal masses or tenderness. Genitalia:  Testes bilaterally descended without nodularity, tenderness or masses. No scrotal masses or lesions. No penis lesions or urethral discharge. Prostate:  Prostate gland firm and smooth, no enlargement, nodularity, tenderness, mass, asymmetry or induration. Msk:  No deformity or scoliosis noted of thoracic or lumbar spine.   Pulses:  R and L carotid,radial,femoral,dorsalis pedis and posterior tibial pulses are full and equal bilaterally Extremities:  No clubbing, cyanosis, edema, or deformity noted with normal full range of motion of all joints.   Neurologic:  No cranial nerve deficits noted. Station and gait are normal. Plantar reflexes are down-going bilaterally. DTRs are symmetrical throughout. Sensory, motor and coordinative functions appear intact. Skin:  total body skin exam normal except for changes on both arms from previous sun damage and deformities of his extremities from previous drug use as a teenager Cervical Nodes:  No lymphadenopathy noted Axillary Nodes:  No palpable lymphadenopathy Inguinal Nodes:  No significant adenopathy Psych:  Cognition and judgment appear intact. Alert and cooperative with normal attention span and concentration. No apparent delusions, illusions, hallucinations   Impression & Recommendations:  Problem # 1:  ERECTILE DYSFUNCTION, ORGANIC (ICD-607.84) Assessment Deteriorated  His updated medication list for this problem includes:    Levitra 20 Mg Tabs (Vardenafil hcl) ..... Uad  Orders: Venipuncture (04540) TLB-Lipid Panel (80061-LIPID) TLB-BMP (Basic Metabolic Panel-BMET) (80048-METABOL) TLB-CBC Platelet - w/Differential (85025-CBCD) TLB-Hepatic/Liver Function Pnl (80076-HEPATIC) TLB-TSH (Thyroid Stimulating Hormone) (84443-TSH) TLB-PSA (Prostate Specific Antigen) (84153-PSA) Prescription Created  Electronically [phone]) Medicare -1st Annual Wellness Visit [phone]) Urinalysis-dipstick only (Medicare patient) (95621HY)  Problem # 2:  BENIGN PROSTATIC HYPERTROPHY (ICD-600.00) Assessment: Unchanged  Orders: Venipuncture (86578) TLB-Lipid Panel (80061-LIPID) TLB-BMP (Basic Metabolic Panel-BMET) (80048-METABOL) TLB-CBC Platelet - w/Differential (85025-CBCD) TLB-Hepatic/Liver Function Pnl (80076-HEPATIC) TLB-TSH (Thyroid Stimulating Hormone) (84443-TSH) TLB-PSA (Prostate Specific Antigen) (84153-PSA) Prescription Created Electronically [phone]) Medicare -1st Annual Wellness Visit [phone]) Urinalysis-dipstick only (Medicare patient) (13244WN)  Problem # 3:  HYPERTENSION (ICD-401.9) Assessment: Improved  His updated medication list for this problem includes:    Tenormin 50 Mg Tabs (Atenolol) ..... Qam    Losartan Potassium 25 Mg Tabs (Losartan potassium) .Marland Kitchen... 1 by mouth once daily    Maxzide 75-50 Mg Tabs (Triamterene-hctz) ..... Qam    Norvasc 5 Mg Tabs (Amlodipine besylate) .Marland Kitchen... Take one tablet daily  Orders: Venipuncture (02725) TLB-Lipid Panel (80061-LIPID) TLB-BMP (Basic Metabolic Panel-BMET) (80048-METABOL) TLB-CBC Platelet - w/Differential (85025-CBCD) TLB-Hepatic/Liver Function Pnl (80076-HEPATIC) TLB-TSH (Thyroid Stimulating Hormone) (84443-TSH) TLB-PSA (Prostate Specific Antigen) (84153-PSA) Prescription Created Electronically [phone]) Medicare -1st Annual Wellness Visit [phone]) Urinalysis-dipstick only (Medicare patient) (25956LO)  Problem # 4:  HYPERLIPIDEMIA (ICD-272.4) Assessment: Improved  His updated medication list for this problem includes:    Lipitor 40 Mg Tabs (Atorvastatin calcium) .Marland Kitchen... 1 tab @ bedtime  Orders: Venipuncture (75643) TLB-Lipid Panel (80061-LIPID) TLB-BMP (Basic Metabolic Panel-BMET) (80048-METABOL) TLB-CBC Platelet - w/Differential (85025-CBCD) TLB-Hepatic/Liver Function Pnl (80076-HEPATIC) TLB-TSH (Thyroid Stimulating Hormone)  (84443-TSH) TLB-PSA (Prostate Specific Antigen) (84153-PSA) Prescription Created Electronically [phone]) Medicare -1st Annual Wellness Visit [phone]) Urinalysis-dipstick only (Medicare patient) (60630ZS)  Problem # 5:  GERD (ICD-530.81) Assessment: Improved  His updated medication list for this problem includes:    Protonix 40 Mg Tbec (Pantoprazole sodium) .Marland Kitchen... 1 pill twice daily (20-30 min prior to breakfast and dinner meals)  Orders: Venipuncture (01093) TLB-Lipid Panel (80061-LIPID) TLB-BMP (Basic Metabolic Panel-BMET) (80048-METABOL)  TLB-CBC Platelet - w/Differential (85025-CBCD) TLB-Hepatic/Liver Function Pnl (80076-HEPATIC) TLB-TSH (Thyroid Stimulating Hormone) (84443-TSH) TLB-PSA (Prostate Specific Antigen) (84153-PSA) Prescription Created Electronically [phone]) Medicare -1st Annual Wellness Visit [phone]) Urinalysis-dipstick only (Medicare patient) (14782NF)  Problem # 6:  Preventive Health Care (ICD-V70.0) Assessment: Unchanged  Complete Medication List: 1)  Nizoral 200 Mg Tabs (Ketoconazole) .... As needed 2)  Tenormin 50 Mg Tabs (Atenolol) .... Qam 3)  Losartan Potassium 25 Mg Tabs (Losartan potassium) .Marland Kitchen.. 1 by mouth once daily 4)  Maxzide 75-50 Mg Tabs (Triamterene-hctz) .... Qam 5)  Flexeril 10 Mg Tabs (Cyclobenzaprine hcl) .... Qhs 6)  Relafen 500  .... Take 1 tablet by mouth two times a day 7)  Flonase 50 Mcg/act Susp (Fluticasone propionate) .... 2 puffs qd 8)  Zyrtec Allergy 10 Mg Tabs (Cetirizine hcl) .... Once daily 9)  Allopurinol 300 Mg Tabs (Allopurinol) .... 1/2 tab once daily 10)  Proair Hfa 108 (90 Base) Mcg/act Aers (Albuterol sulfate) .... Take one to two puffs as needed 11)  Lipitor 40 Mg Tabs (Atorvastatin calcium) .Marland Kitchen.. 1 tab @ bedtime 12)  Protonix 40 Mg Tbec (Pantoprazole sodium) .Marland Kitchen.. 1 pill twice daily (20-30 min prior to breakfast and dinner meals) 13)  Saw Palmetto 1000 Mg Caps (Saw palmetto (serenoa repens)) .... One tab two times a day 14)   Glucosamine-chondroitin 500-400 Mg Caps (Glucosamine-chondroitin) .... Take one tab three times a day 15)  Levitra 20 Mg Tabs (Vardenafil hcl) .... Uad 16)  Spiriva Handihaler 18 Mcg Caps (Tiotropium bromide monohydrate) .... Use once daily 17)  Potassium Chloride Crys Cr 20 Meq Cr-tabs (Potassium chloride crys cr) .... Take 1 tablet by mouth every morning 18)  Amitriptyline Hcl 25 Mg Tabs (Amitriptyline hcl) .Marland Kitchen.. 1 tab at bedtime for sleep 19)  Norvasc 5 Mg Tabs (Amlodipine besylate) .... Take one tablet daily 20)  Nitroglycerin 0.2 Mg/hr Pt24 (Nitroglycerin) .... Use 1/4 patch daily to your achille's tendon.  change daily  Other Orders: Specimen Handling (62130)  Patient Instructions: 1)  Please schedule a follow-up appointment in 1 year. Prescriptions: FLEXERIL 10 MG  TABS (CYCLOBENZAPRINE HCL) QHS  #100 x 4   Entered and Authorized by:   Roderick Pee MD   Signed by:   Roderick Pee MD on 06/10/2010   Method used:   Print then Give to Patient   RxID:   [id number] NORVASC 5 MG TABS (AMLODIPINE BESYLATE) take one tablet daily  #100 x 4   Entered and Authorized by:   Roderick Pee MD   Signed by:   Roderick Pee MD on 06/10/2010   Method used:   Electronically to        [facility]. [address] [phone]* (retail)       [address] [place], [place]  [address]       Ph: [phone]       Fax: [phone]   RxID:   [id number] AMITRIPTYLINE HCL 25 MG TABS (AMITRIPTYLINE HCL) 1 tab at bedtime for sleep  #100 x 4   Entered and Authorized by:   Roderick Pee MD   Signed by:   Roderick Pee MD on 06/10/2010   Method used:   Electronically to        [facility]. [address] [phone]* (retail)       [address] [place], [place]  [address]       Ph: [phone]  Fax: [phone]   RxID:   [id number] POTASSIUM CHLORIDE CRYS CR 20 MEQ CR-TABS (POTASSIUM CHLORIDE CRYS CR) Take 1 tablet by mouth every morning  #100 x 4   Entered and Authorized by:    Roderick Pee MD   Signed by:   Roderick Pee MD on 06/10/2010   Method used:   Electronically to        [facility]. [address] [phone]* (retail)       [address] [place], [place]  [address]       Ph: [phone]       Fax: [phone]   RxID:   [id number] SPIRIVA HANDIHALER 18 MCG CAPS (TIOTROPIUM BROMIDE MONOHYDRATE) use once daily  #3 x 4   Entered and Authorized by:   Roderick Pee MD   Signed by:   Roderick Pee MD on 06/10/2010   Method used:   Electronically to        [facility]. [address] [phone]* (retail)       [address] [place], [place]  [address]       Ph: [phone]       Fax: [phone]   RxID:   [id number] PROTONIX 40 MG  TBEC (PANTOPRAZOLE SODIUM) 1 pill twice daily (20-30 min prior to breakfast and dinner meals)  #200 x 4   Entered and Authorized by:   Roderick Pee MD   Signed by:   Roderick Pee MD on 06/10/2010   Method used:   Electronically to        [facility]. [address] [phone]* (retail)       [address] [place], [place]  [address]       Ph: [phone]       Fax: [phone]   RxID:   [id number] LIPITOR 40 MG  TABS (ATORVASTATIN CALCIUM) 1 tab @ bedtime  #100 x 4   Entered and Authorized by:   Roderick Pee MD   Signed by:   Roderick Pee MD on 06/10/2010   Method used:   Electronically to        [facility]. [address] [phone]* (retail)       [address] [place], [place]  [address]       Ph: [phone]       Fax: [phone]   RxID:   [id number] ALLOPURINOL 300 MG  TABS (ALLOPURINOL) 1/2 tab once daily  #100 x 4   Entered and Authorized by:   Roderick Pee MD   Signed by:   Roderick Pee MD on 06/10/2010   Method used:   Electronically to        [facility]. [address] [phone]* (retail)       [address] [place], [place]  [address]       Ph: [phone]       Fax: [phone]   RxID:   [id number] FLONASE 50 MCG/ACT  SUSP (FLUTICASONE PROPIONATE) 2  PUFFS QD  #3 x 4   Entered and Authorized by:   Roderick Pee MD   Signed by:   Roderick Pee MD on 06/10/2010   Method used:   Electronically to        [facility]. [employer] [phone]* (retail)  [address] [place], [place]  [address]       Ph: [phone]       Fax: [phone]   RxID:   [id number] RELAFEN 500 Take 1 tablet by mouth two times a day  #200 x 4   Entered and Authorized by:   Roderick Pee MD   Signed by:   Roderick Pee MD on 06/10/2010   Method used:   Print then Give to Patient   RxID:   [id number] MAXZIDE 75-50 MG  TABS (TRIAMTERENE-HCTZ) QAM  #100 x 4   Entered and Authorized by:   Roderick Pee MD   Signed by:   Roderick Pee MD on 06/10/2010   Method used:   Electronically to        [facility]. [address] [phone]* (retail)       [address] [place], [place]  [address]       Ph: [phone]       Fax: [phone]   RxID:   [id number] SEGBTDVV POTASSIUM 25 MG TABS (LOSARTAN POTASSIUM) 1 by mouth once daily  #100 x 4   Entered and Authorized by:   Roderick Pee MD   Signed by:   Roderick Pee MD on 06/10/2010   Method used:   Electronically to        [facility]. [address] [phone]* (retail)       [address] [place], [place]  [address]       Ph: [phone]       Fax: [phone]   RxID:   [id number] TENORMIN 50 MG  TABS (ATENOLOL) QAM  #100 x 4   Entered and Authorized by:   Roderick Pee MD   Signed by:   Roderick Pee MD on 06/10/2010   Method used:   Electronically to        [facility]. [address] [phone]* (retail)       [address] [place], [place]  [address]       Ph: [phone]       Fax: [phone]   RxID:   [id number]    Orders Added: 1)  Venipuncture [25053] 2)  TLB-Lipid Panel [80061-LIPID] 3)  TLB-BMP (Basic Metabolic Panel-BMET) [80048-METABOL] 4)  TLB-CBC Platelet - w/Differential [85025-CBCD] 5)  TLB-Hepatic/Liver Function Pnl [80076-HEPATIC] 6)   TLB-TSH (Thyroid Stimulating Hormone) [84443-TSH] 7)  TLB-PSA (Prostate Specific Antigen) [97673-ALP] 8)  Prescription Created Electronically [G8553] 9)  Medicare -1st Annual Wellness Visit [G0438] 10)  Urinalysis-dipstick only (Medicare patient) [37902IO] 11)  Specimen Handling [99000]        Laboratory Results   Urine Tests  Date/Time Recieved: June 10, 2010 10:07 AM  Date/Time Reported: June 10, 2010 10:07 AM   Routine Urinalysis   Color: yellow Appearance: Clear Glucose: negative   (Normal Range: Negative) Bilirubin: negative   (Normal Range: Negative) Ketone: negative   (Normal Range: Negative) Spec. Gravity: 1.020   (Normal Range: 1.003-1.035) Blood: trace-intact   (Normal Range: Negative) pH: 5.5   (Normal Range: 5.0-8.0) Protein: negative   (Normal Range: Negative) Urobilinogen: 0.2   (Normal Range: 0-1) Nitrite: negative   (Normal Range: Negative) Leukocyte Esterace: trace   (Normal Range: Negative)    Comments: Wynona Canes, CMA  June 10, 2010 10:07 AM     Appended Document:  CPX (PT WILL COME IN FASTING) // RS     Clinical Lists Changes  Observations: Added new observation of COLONOSCOPY: polps (07/25/2007 16:45)         Preventive Care Screening  Colonoscopy:    Date:  07/25/2007    Results:  polps

## 2010-06-17 NOTE — Assessment & Plan Note (Signed)
Summary: NP ACHILLES/PT REFERRAL - FAILING PT   Vital Signs:  Patient profile:   70 year old male Height:      70 inches Weight:      185 pounds Pulse rate:   80 / minute BP sitting:   150 / 89  (right arm)  Vitals Entered By: Rochele Pages RN (May 19, 2010 8:52 AM) CC: lt achilles tendonitis   Referring Provider:  Arvilla Meres Primary Provider:  Kelle Darting, MD  CC:  lt achilles tendonitis.  History of Present Illness: 70 yo M here for Lt heel/post foot pain that he thinks he is achille's tendinitis present for last 2-3 years.  Initially occurred after he stepped in mudhole while shrimping a few summers ago.  Has done 2 rounds of PT over that time, and has done some home exercises and strengthening as well.  Regularly exerises.   Has dull pain all the time (4/10), ok when he first wakes up. But with prolonged walking and activity, pain worsens up to 10/10.  Starting to interfere with his daily activities and way of life.  PT told him he might be candidate for NTG, so referred here. Pain is posterior location, thinks there is some thickening of his AT as well. Uses relafen for OA, helping minimally for this.  Allergies: 1)  ! 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  Deformity, left hand, secondary to prior drug abuse  Past Surgical History: Last updated: 12/08/2006 Transurethral resection of prostate  Family History: Last updated: 05/30/2007 father retired, M.D., in [place], [place], had gallbladder disease, and colon polyps.  Mother died of an MI.  She had arthritis, hypertension, smoker, and peripheral vascular disease.  One brother in good health two sisters one has arthritis.  Social History: Last updated: 03/11/2009 Retired Ph.D.  ran need genetics research lab at [employer] Married Former Smoker  1 small cigar/day x56yrs.  Quit in 2007. Alcohol use-no Drug use-no Regular exercise-yes  Risk Factors: Exercise: yes (05/30/2007)  Risk Factors: Smoking Status: quit (05/30/2007)  Review of Systems       The patient complains of difficulty walking.  The patient denies anorexia, fever, weight loss, depression, and unusual weight change.    Physical Exam  General:  Well-developed,well-nourished,in no acute distress; alert,appropriate and cooperative throughout examination Head:  normocephalic.   Eyes:  vision grossly intact.   Neck:  supple.   Lungs:  normal respiratory effort.   Abdomen:  soft.   Msk:  Leg length: equal  Lt ankle: obvious thickening of AT.  Mild ttp 3-4 cm proximal to AT insertion on calc.  No redness.  No PF insertion ttp.  MSK [place]:  Lt AT thickness 1.3 cm (Rt AT 0.36cm).  Lt AT with several mid substance tears, calcifications.  Multiple neovessels. Neurologic:  alert & oriented X3.     Impression & Recommendations:  Problem # 1:  ACHILLES TENDINITIS (ICD-726.71) Assessment New  Chronic Achille's tendinopathy with partial tear. - start NTG patch protocol 1/4 patch daily, warned about H/As - Reviewed eccentric strengthening exercises - avoid stretches - f/u 4 weeks for repeat US  Orders: [place] LIMITED ([id number])  Complete Medication List: 1)  Nizoral 200 Mg Tabs (Ketoconazole) .... As needed 2)  Tenormin 50 Mg Tabs (Atenolol) .... Qam 3)  Losartan Potassium 25 Mg Tabs (Losartan potassium) .Marland KitchenMarland KitchenMarland Kitchen  1 by mouth once daily 4)  Maxzide 75-50 Mg Tabs (Triamterene-hctz) .... Qam 5)  Flexeril 10 Mg Tabs (Cyclobenzaprine hcl) .... Qhs 6)  Relafen 500  .... Take 1 tablet by mouth two times a day 7)  Flonase 50 Mcg/act Susp (Fluticasone propionate) .... 2 puffs qd 8)  Zyrtec Allergy 10 Mg Tabs (Cetirizine hcl) .... Once daily 9)  Allopurinol 300 Mg Tabs (Allopurinol) .... 1/2 tab  once daily 10)  Proair Hfa 108 (90 Base) Mcg/act Aers (Albuterol sulfate) .... Take one to two puffs as needed 11)  Colchicine 0.6 Mg Tabs (Colchicine) .Marland Kitchen.. 1 q 6h. as needed gout 12)  Lipitor 40 Mg Tabs (Atorvastatin calcium) .Marland Kitchen.. 1 tab @ bedtime 13)  Protonix 40 Mg Tbec (Pantoprazole sodium) .Marland Kitchen.. 1 pill twice daily (20-30 min prior to breakfast and dinner meals) 14)  Saw Palmetto 1000 Mg Caps (Saw palmetto (serenoa repens)) .... One tab two times a day 15)  Glucosamine-chondroitin 500-400 Mg Caps (Glucosamine-chondroitin) .... Take one tab three times a day 16)  Levitra 20 Mg Tabs (Vardenafil hcl) .... Uad 17)  Spiriva Handihaler 18 Mcg Caps (Tiotropium bromide monohydrate) .... Use once daily 18)  Potassium Chloride Crys Cr 20 Meq Cr-tabs (Potassium chloride crys cr) .... Take 1 tablet by mouth every morning 19)  Amitriptyline Hcl 25 Mg Tabs (Amitriptyline hcl) .... 1/2 tab at bedtime for sleep 20)  Norvasc 5 Mg Tabs (Amlodipine besylate) .... Take one tablet daily 21)  Nitroglycerin 0.2 Mg/hr Pt24 (Nitroglycerin) .... Use 1/4 patch daily to your achille's tendon.  change daily  Patient Instructions: 1)  Do straight knee heel drops and raises on a thick book 3 sets of 15 reps twice daily. 2)  Do bent knee heel drops and raises on a thick book 3 sets of 15 reps twice daily. 3)  Do toe walks 10-15 steps twice daily. 4)  Do heel walks 10-15 steps twice daily. 5)  Do nitroglycerin patches daily. 6)  Avoid lots of excessive stretching. 7)  Follow up in 6 weeks for repeat ultrasound. Prescriptions: NITROGLYCERIN 0.2 MG/HR PT24 (NITROGLYCERIN) Use 1/4 patch daily to your Achille's tendon.  Change daily  #30 x 0   Entered and Authorized by:   Corbin Ade MD   Signed by:   Corbin Ade MD on 05/19/2010   Method used:   Electronically to        [facility]. [address] [phone]* (retail)       [address] [place], [place]  [id number]       Ph: [phone]       Fax: [phone]   RxID:    [id number]    Orders Added: 1)  New Patient Level III [99203] 2)  [place] LIMITED [[id number]]  Appended Document: NP ACHILLES/PT REFERRAL - FAILING PT Also gave him some felt pads to place in other shoes for heel lift support

## 2010-06-17 NOTE — Assessment & Plan Note (Signed)
Summary: WOULD LIKE TO RE ESTABLISH  Medications Added NORVASC 5 MG TABS (AMLODIPINE BESYLATE) take one tablet daily      Allergies Added:   Visit Type:  Initial Consult Referring Brittie Whisnant:  Bensimhon,Daniel Primary Rodolfo Notaro:  Kelle Darting, MD  CC:  Re establish care.  c/o chest discomfort in upper left quadrant..  History of Present Illness: Mr. Heatwole is a 70 year old gentleman with history of COPD, hypertension, hyperlipidemia, who presents for evaluation of chest discomfort. He was previously seen over 5 years ago and had a stress test at that time though the results are not available to [place].  He reports that over the past 3 months, he has had a dull ache in his left upper chest. He has it at rest, sometimes with exertion. He is unable to describe it well though it is a low grade, gnawing, constant ache it does come and go. It does not seem to be associated with exertion. He does not have any other associated symptoms. He does not remember lifting or hurting his left chest or left arm. He has hurt his Achilles and has not been doing his usual exercise.  EKG shows normal sinus rhythm with rate of 65 beats per minute, no significant ST or T wave changes  Current Medications (verified): 1)  Nizoral 200 Mg Tabs (Ketoconazole) .... As Needed 2)  Tenormin 50 Mg  Tabs (Atenolol) .... Qam 3)  Losartan Potassium 25 Mg Tabs (Losartan Potassium) .Marland Kitchen.. 1 By Mouth Once Daily 4)  Maxzide 75-50 Mg  Tabs (Triamterene-Hctz) .... Qam 5)  Flexeril 10 Mg  Tabs (Cyclobenzaprine Hcl) .... Qhs 6)  Relafen 500 .... Take 1 Tablet By Mouth Two Times A Day 7)  Flonase 50 Mcg/act  Susp (Fluticasone Propionate) .... 2 Puffs Qd 8)  Zyrtec Allergy 10 Mg Tabs (Cetirizine Hcl) .... Once Daily 9)  Allopurinol 300 Mg  Tabs (Allopurinol) .... 1/2 Tab Once Daily 10)  Proair Hfa 108 (90 Base) Mcg/act  Aers (Albuterol Sulfate) .... Take One To Two Puffs As Needed 11)  Colchicine 0.6 Mg  Tabs (Colchicine) .Marland Kitchen.. 1 Q 6h.  As Needed Gout 12)  Lipitor 40 Mg  Tabs (Atorvastatin Calcium) .Marland Kitchen.. 1 Tab @ Bedtime 13)  Protonix 40 Mg  Tbec (Pantoprazole Sodium) .Marland Kitchen.. 1 Pill Twice Daily (20-30 Min Prior To Breakfast and Dinner Meals) 14)  Saw Palmetto 1000 Mg Caps (Saw Palmetto (Serenoa Repens)) .... One Tab Two Times A Day 15)  Glucosamine-Chondroitin 500-400 Mg Caps (Glucosamine-Chondroitin) .... Take One Tab Three Times A Day 16)  Levitra 20 Mg Tabs (Vardenafil Hcl) .... Uad 17)  Spiriva Handihaler 18 Mcg Caps (Tiotropium Bromide Monohydrate) .... Use Once Daily 18)  Potassium Chloride Crys Cr 20 Meq Cr-Tabs (Potassium Chloride Crys Cr) .... Take 1 Tablet By Mouth Every Morning 19)  Amitriptyline Hcl 25 Mg Tabs (Amitriptyline Hcl) .... 1/2 Tab At Bedtime For Sleep 20)  Norvasc 5 Mg Tabs (Amlodipine Besylate) .... Take One Tablet Daily  Allergies (verified): 1)  ! 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  Deformity, left hand, secondary to prior drug abuse  Past Surgical History: Last updated: 12/08/2006 Transurethral resection of prostate  Family History: Last updated: 05/30/2007 father retired, M.D., in [place], [place], had gallbladder disease, and colon polyps.  Mother died of an MI.  She had arthritis, hypertension, smoker, and peripheral vascular disease.  One brother in good health two sisters one has arthritis.  Social History: Last updated: 03/11/2009 Retired Ph.D. ran need genetics research lab at [employer] Married Former Smoker  1 small cigar/day x49yrs.  Quit in 2007. Alcohol use-no Drug use-no Regular exercise-yes  Risk Factors: Exercise: yes (05/30/2007)  Risk Factors: Smoking Status: quit (05/30/2007)  Review of Systems       The patient  complains of chest pain.  The patient denies fever, vision loss, decreased hearing, hoarseness, syncope, dyspnea on exertion, peripheral edema, prolonged cough, abdominal pain, incontinence, muscle weakness, depression, and enlarged lymph nodes.         dull ache in left chest  Vital Signs:  Patient profile:   70 year old male Height:      70 inches Weight:      192 pounds BMI:     27.65 Pulse rate:   64 / minute BP sitting:   122 / 80  (left arm) Cuff size:   regular  Vitals Entered By: Bishop Dublin, CMA (May 07, 2010 10:05 AM)  Physical Exam  General:  Well developed, well nourished, in no acute distress. Head:  normocephalic and atraumatic Neck:  Neck supple, no JVD. No masses, thyromegaly or abnormal cervical nodes. Chest Wall:  unable to reproduce any pain with palpation of the left chest or arm Lungs:  mildly decreased breath sounds throughout otherwise clear Heart:  Non-displaced PMI, chest non-tender; regular rate and rhythm, S1, S2 without murmurs, rubs or gallops. Carotid upstroke normal, no bruit.Pedals normal pulses. No edema, no varicosities. Abdomen:  Bowel sounds positive; abdomen soft and non-tender without masses Msk:  Back normal, normal gait. Muscle strength and tone normal. 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 Skin:  Intact without lesions or rashes. Psych:  Normal affect.   Impression & Recommendations:  Problem # 1:  CHEST PAIN-UNSPECIFIED (ICD-786.50) recent chest pain of uncertain etiology. No significant smoking history apart from some cigars. Cholesterols well-controlled. No EKG changes. No symptoms with exertion. We have suggested that we monitor his symptoms for now as it does seem to have gone away over the past 2 weeks. I've asked him to contact me if he has further chest pain symptoms at which time we could order a CT scan. He  reports not doing well on a treadmill And has Achilles problems.  His updated medication list for this problem includes:    Tenormin 50 Mg Tabs (Atenolol) ..... Qam    Norvasc 5 Mg Tabs (Amlodipine besylate) .Marland Kitchen... Take one tablet daily  Problem # 2:  HYPERTENSION (ICD-401.9) blood pressure is well controlled on his current medication regimen. No changes made.  His updated medication list for this problem includes:    Tenormin 50 Mg Tabs (Atenolol) ..... Qam    Losartan Potassium 25 Mg Tabs (Losartan potassium) .Marland Kitchen... 1 by mouth once daily    Maxzide 75-50 Mg Tabs (Triamterene-hctz) ..... Qam    Norvasc 5 Mg Tabs (Amlodipine besylate) .Marland Kitchen... Take one tablet daily  Problem # 3:  HYPERLIPIDEMIA (ICD-272.4) Cholesterol last year was in a reasonable range. He is to have a repeat lab work panel done next month.  His updated medication list for this problem includes:    Lipitor 40 Mg Tabs (Atorvastatin calcium) .Marland Kitchen... 1 tab @ bedtime  Patient Instructions: 1)  Your physician recommends that you follow  up as needed. 2)  Your physician recommends that you continue on your current medications as directed. Please refer to the Current Medication list given to you today.

## 2010-06-18 ENCOUNTER — Other Ambulatory Visit: Payer: Self-pay | Admitting: Family Medicine

## 2010-06-18 DIAGNOSIS — N529 Male erectile dysfunction, unspecified: Secondary | ICD-10-CM

## 2010-06-23 NOTE — Assessment & Plan Note (Signed)
Summary: FU/MC/MJD   Vital Signs:  Patient profile:   70 year old male Pulse rate:   66 / minute BP sitting:   129 / 78  (left arm)  Vitals Entered By: Rochele Pages RN (June 16, 2010 9:23 AM) CC: f/u L achilles tendonitis- 25% improved   Referring Provider:  Arvilla Meres Primary Provider:  Kelle Darting, MD  CC:  f/u L achilles tendonitis- 25% improved.  History of Present Illness: Now has done the exercises twice daily very consistently from last visit definitely stronger able to walk and put foot down completely  last time Left AT was 1.3 cms  hurts this w fishing and steps in hole  Has had 3 years of sxs  Says that at least 25% improved but he does have headaches several times a week on NTG  Preventive Screening-Counseling & Management  Alcohol-Tobacco     Smoking Status: quit  Allergies: 1)  ! Codeine  Physical Exam  General:  Well-developed,well-nourished,in no acute distress; alert,appropriate and cooperative throughout examination Msk:  Left AT still shows thickened nodule the nodule is not very tender mod TTP just above the nodule heel insertion is pain free  on gait his limp is less does get some pushoff with left foot now Additional Exam:  MSK [place] compared to last exam there is now some new calcification within the nodule and seems to have closed area of tearing in central nodule on trans scan Still is 1.3 cms thick increase in neovessels vs last scan note at area of above nodule there is some hypoechoic change on long scan and on trans scan there are 2 descreet areas of hypoechoic change marked neovascularity in this region   Impression & Recommendations:  Problem # 1:  ACHILLES TENDINITIS (ICD-726.71) Assessment Improved  This has made some progress  I do think NTG is helping if he can tolerate the sideeffects keep up exercises keep up NTG patches ice end of day use heel lifts  reck 6 weeks  Orders: [place] LIMITED  ([id number])  Complete Medication List: 1)  Nizoral 200 Mg Tabs (Ketoconazole) .... As needed 2)  Tenormin 50 Mg Tabs (Atenolol) .... Qam 3)  Losartan Potassium 25 Mg Tabs (Losartan potassium) .Marland Kitchen.. 1 by mouth once daily 4)  Maxzide 75-50 Mg Tabs (Triamterene-hctz) .... Qam 5)  Flexeril 10 Mg Tabs (Cyclobenzaprine hcl) .... Qhs 6)  Relafen 500  .... Take 1 tablet by mouth two times a day 7)  Flonase 50 Mcg/act Susp (Fluticasone propionate) .... 2 puffs qd 8)  Zyrtec Allergy 10 Mg Tabs (Cetirizine hcl) .... Once daily 9)  Allopurinol 300 Mg Tabs (Allopurinol) .... 1/2 tab once daily 10)  Proair Hfa 108 (90 Base) Mcg/act Aers (Albuterol sulfate) .... Take one to two puffs as needed 11)  Lipitor 40 Mg Tabs (Atorvastatin calcium) .Marland Kitchen.. 1 tab @ bedtime 12)  Protonix 40 Mg Tbec (Pantoprazole sodium) .Marland Kitchen.. 1 pill twice daily (20-30 min prior to breakfast and dinner meals) 13)  Saw Palmetto 1000 Mg Caps (Saw palmetto (serenoa repens)) .... One tab two times a day 14)  Glucosamine-chondroitin 500-400 Mg Caps (Glucosamine-chondroitin) .... Take one tab three times a day 15)  Levitra 20 Mg Tabs (Vardenafil hcl) .... Uad 16)  Spiriva Handihaler 18 Mcg Caps (Tiotropium bromide monohydrate) .... Use once daily 17)  Potassium Chloride Crys Cr 20 Meq Cr-tabs (Potassium chloride crys cr) .... Take 1 tablet by mouth every morning 18)  Amitriptyline Hcl 25 Mg Tabs (Amitriptyline hcl) .Marland Kitchen.. 1 tab  at bedtime for sleep 19)  Norvasc 5 Mg Tabs (Amlodipine besylate) .... Take one tablet daily 20)  Nitroglycerin 0.2 Mg/hr Pt24 (Nitroglycerin) .... Use 1/4 patch daily to your achille's tendon.  change daily   Orders Added: 1)  Est. Patient Level III [24401] 2)  [place] LIMITED [[id number]]

## 2010-06-28 ENCOUNTER — Encounter: Payer: Self-pay | Admitting: Adult Health

## 2010-06-28 ENCOUNTER — Ambulatory Visit (INDEPENDENT_AMBULATORY_CARE_PROVIDER_SITE_OTHER)
Admission: RE | Admit: 2010-06-28 | Discharge: 2010-06-28 | Disposition: A | Discharge status: Home / Self Care | Payer: Medicare Other | Source: Ambulatory Visit | Attending: Emergency Medicine | Admitting: Emergency Medicine

## 2010-06-28 ENCOUNTER — Ambulatory Visit (INDEPENDENT_AMBULATORY_CARE_PROVIDER_SITE_OTHER): Payer: Medicare Other | Admitting: Adult Health

## 2010-06-28 ENCOUNTER — Other Ambulatory Visit: Payer: Self-pay | Admitting: Emergency Medicine

## 2010-06-28 DIAGNOSIS — R079 Chest pain, unspecified: Secondary | ICD-10-CM

## 2010-06-28 DIAGNOSIS — J4489 Other specified chronic obstructive pulmonary disease: Secondary | ICD-10-CM

## 2010-06-28 DIAGNOSIS — J449 Chronic obstructive pulmonary disease, unspecified: Secondary | ICD-10-CM

## 2010-07-07 NOTE — Assessment & Plan Note (Signed)
Summary: Acute NP office visit - ?chest wall pain   Copy to:  Bensimhon,Daniel Primary Provider/Referring Provider:  Kelle Darting, MD  CC:  dull ache in upper left quandrant of chest x21months, described as "not acute, and just there."  states has followed up with cards and pcp.  would like to be checked out from a "pulmonary standpoint".  History of Present Illness: 70 yo man with COPD, allergic rhinitis, GERD.    March 11, 2009--Presents for an Acute Visit.  Pt c/o chest congestion, wheezing, dry cough, inc SOB .Symptoms started 1 month ago, now worse this past week. Minimally productive cough, has several coughing fits. Wheezing is getting worse. gerd worse over last week. Severe cough at times. Denies chest pain,  orthopnea, hemoptysis, fever, n/v/d, edema, headache.   ROV 04/13/09 -- was seen a month ago with cough, was changed from altase to benicar. Adjusted by Dr Tawanna Cooler - now on Benicar 5mg  + norvasc 5mg . Was given prednisone taper at that visit too, but didn't take it because it usually gives him side effects. He is still able to exert, but has noticed a difference in his overall fxn.   ROV 06/29/09 -- regular f/u for COPD. Has had some URI signs last several days. Having cough, scant sputum. Some nasal congestion. Not really manifesting itself as a change in his breathing, wheeze. Has been using mucinex. Overall breathing has been stable, he doesn't feels that he needs any addition to his maintanance regimen.   June 28, 2010 --Presents for an acute office visit.Complains of a dull ache in upper left  chest wall x46months, described as "not acute, just there."  He was seen by cardiology couple of weeks ago felt to be noncardiac pain. Also seen by PCP ?musculoskeletal pain.   Says over last several weeks his breathing not as good as normal , has had to use rescue inhaler more often. NO calf pain or recent travel. Some wheezing intermittently.Denies  orthopnea, hemoptysis, fever, n/v/d,  edema, headache. Describes as fleeting chest pain-dull at times. Non exertional. No pain on inspiration. Intermittently tender Has increased reflux despite two times a day PPI. Has used Advil on /off but not on consitstent basis with some help.  Medications Prior to Update: 1)  Nizoral 200 Mg Tabs (Ketoconazole) .... As Needed 2)  Tenormin 50 Mg  Tabs (Atenolol) .... Qam 3)  Losartan Potassium 25 Mg Tabs (Losartan Potassium) .Marland Kitchen.. 1 By Mouth Once Daily 4)  Maxzide 75-50 Mg  Tabs (Triamterene-Hctz) .... Qam 5)  Flexeril 10 Mg  Tabs (Cyclobenzaprine Hcl) .... Qhs 6)  Relafen 500 .... Take 1 Tablet By Mouth Two Times A Day 7)  Flonase 50 Mcg/act  Susp (Fluticasone Propionate) .... 2 Puffs Qd 8)  Zyrtec Allergy 10 Mg Tabs (Cetirizine Hcl) .... Once Daily 9)  Allopurinol 300 Mg  Tabs (Allopurinol) .... 1/2 Tab Once Daily 10)  Proair Hfa 108 (90 Base) Mcg/act  Aers (Albuterol Sulfate) .... Take One To Two Puffs As Needed 11)  Lipitor 40 Mg  Tabs (Atorvastatin Calcium) .Marland Kitchen.. 1 Tab @ Bedtime 12)  Protonix 40 Mg  Tbec (Pantoprazole Sodium) .Marland Kitchen.. 1 Pill Twice Daily (20-30 Min Prior To Breakfast and [employer]) 13)  Saw Palmetto 1000 Mg Caps (Saw Palmetto (Serenoa Repens)) .... One Tab Two Times A Day 14)  Glucosamine-Chondroitin 500-400 Mg Caps (Glucosamine-Chondroitin) .... Take One Tab Three Times A Day 15)  Levitra 20 Mg Tabs (Vardenafil Hcl) .... Uad 16)  Spiriva Handihaler 18 Mcg Caps (Tiotropium  Bromide Monohydrate) .... Use Once Daily 17)  Potassium Chloride Crys Cr 20 Meq Cr-Tabs (Potassium Chloride Crys Cr) .... Take 1 Tablet By Mouth Every Morning 18)  Amitriptyline Hcl 25 Mg Tabs (Amitriptyline Hcl) .Marland Kitchen.. 1 Tab At Bedtime For Sleep 19)  Norvasc 5 Mg Tabs (Amlodipine Besylate) .... Take One Tablet Daily 20)  Nitroglycerin 0.2 Mg/hr Pt24 (Nitroglycerin) .... Use 1/4 Patch Daily To Your Achille's Tendon.  Change Daily  Current Medications (verified): 1)  Nizoral 200 Mg Tabs (Ketoconazole) ....  As Needed 2)  Tenormin 50 Mg  Tabs (Atenolol) .... Qam 3)  Losartan Potassium 25 Mg Tabs (Losartan Potassium) .Marland Kitchen.. 1 By Mouth Once Daily 4)  Maxzide 75-50 Mg  Tabs (Triamterene-Hctz) .... Qam 5)  Flexeril 10 Mg  Tabs (Cyclobenzaprine Hcl) .... Qhs 6)  Relafen 500 .... Take 1 Tablet By Mouth Two Times A Day 7)  Flonase 50 Mcg/act  Susp (Fluticasone Propionate) .... 2 Puffs Qd 8)  Zyrtec Allergy 10 Mg Tabs (Cetirizine Hcl) .... Once Daily 9)  Allopurinol 300 Mg  Tabs (Allopurinol) .... 1/2 Tab Once Daily 10)  Proair Hfa 108 (90 Base) Mcg/act  Aers (Albuterol Sulfate) .... Take One To Two Puffs As Needed 11)  Lipitor 40 Mg  Tabs (Atorvastatin Calcium) .Marland Kitchen.. 1 Tab @ Bedtime 12)  Protonix 40 Mg  Tbec (Pantoprazole Sodium) .Marland Kitchen.. 1 Pill Twice Daily (20-30 Min Prior To Breakfast and [employer]) 13)  Saw Palmetto 1000 Mg Caps (Saw Palmetto (Serenoa Repens)) .... One Tab Two Times A Day 14)  Glucosamine-Chondroitin 500-400 Mg Caps (Glucosamine-Chondroitin) .... Take One Tab Three Times A Day 15)  Levitra 20 Mg Tabs (Vardenafil Hcl) .... Uad 16)  Spiriva Handihaler 18 Mcg Caps (Tiotropium Bromide Monohydrate) .... Use Once Daily 17)  Potassium Chloride Crys Cr 20 Meq Cr-Tabs (Potassium Chloride Crys Cr) .... Take 1 Tablet By Mouth Every Morning 18)  Amitriptyline Hcl 25 Mg Tabs (Amitriptyline Hcl) .Marland Kitchen.. 1 Tab At Bedtime For Sleep 19)  Norvasc 5 Mg Tabs (Amlodipine Besylate) .... Take One Tablet Daily 20)  Nitroglycerin 0.2 Mg/hr Pt24 (Nitroglycerin) .... Use 1/4 Patch Daily To Your Achille's Tendon.  Change Daily 21)  Sudafed Pe Sinus/allergy 4-10 Mg Tabs (Chlorpheniramine-Phenylephrine) .... As Needed  Allergies (verified): 1)  ! 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   Deformity, left hand, secondary to prior drug abuse  Past Surgical History: Last updated: 12/08/2006 Transurethral resection of prostate  Family History: Last updated: 05/30/2007 father retired, M.D., in [place], [place], had gallbladder disease, and colon polyps.  Mother died of an MI.  She had arthritis, hypertension, smoker, and peripheral vascular disease.  One brother in good health two sisters one has arthritis.  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  Quit in 2007. Alcohol use-no Drug use-no Regular exercise-yes  Risk Factors: Smoking Status: quit (06/16/2010)  Review of Systems      See HPI  Vital Signs:  Patient profile:   70 year old male Height:      70 inches Weight:      197.13 pounds BMI:     28.39 O2 Sat:      97 % on Room air Temp:     97.2 degrees F oral Pulse rate:   67 /  minute BP sitting:   142 / 84  (left arm) Cuff size:   regular  Vitals Entered By: Boone Master CNA/MA (June 28, 2010 9:39 AM)  O2 Flow:  Room air CC: dull ache in upper left quandrant of chest x20months, described as "not acute, just there."  states has followed up with cards and pcp.  would like to be checked out from a "pulmonary standpoint" Is Patient Diabetic? No Comments Medications reviewed with patient Daytime contact number verified with patient. Boone Master CNA/MA  June 28, 2010 9:40 AM    Physical Exam  Additional Exam:  GEN: A/Ox3; pleasant , NAD HEENT:  [place]/AT, , EACs-clear, TMs-wnl, NOSE-clear, THROAT-clear NECK:  Supple w/ fair ROM; no JVD; normal carotid impulses w/o bruits; no thyromegaly or nodules palpated; no lymphadenopathy. RESP  distant no wheeze, nontender chest wall. CARD:  RRR, no m/r/g   GI:  soft ,nontender ,no guarding.or rebound. NO bruit noted.  Musco: Warm bil,  no calf tenderness edema, clubbing, pulses  intact Neuro: intact w/ no focal deficits    Impression & Recommendations:  Problem # 1:  CHEST PAIN-UNSPECIFIED (ICD-786.50)  ?etiology possible -MSK chest wall pain , _+/- GERD +/- COPD flare.  CXR today with no acute changes  Plan: Prednisone taper over next week. Warm heat to chest wall.  Pepcid 20mg  at bedtime for 2 weeks  GERD Diet.  follow up Dr. Delton Coombes in 2 weeks Please contact office for sooner follow up if symptoms do not improve or worsen   Orders: T-2 View CXR (71020TC) Est. Patient Level IV (16109)  Medications Added to Medication List This Visit: 1)  Sudafed Pe Sinus/allergy 4-10 Mg Tabs (Chlorpheniramine-phenylephrine) .... As needed 2)  Prednisone 10 Mg Tabs (Prednisone) .... 4 tabs for 2 days, then 3 tabs for 2 days, 2 tabs for 2 days, then 1 tab for 2 days, then stop  Patient Instructions: 1)  Prednisone taper over next week. 2)  Warm heat to chest wall.  3)  Pepcid 20mg  at bedtime for 2 weeks  4)  GERD Diet.  5)  follow up Dr. Delton Coombes in 2 weeks 6)  Please contact office for sooner follow up if symptoms do not improve or worsen  Prescriptions: PREDNISONE 10 MG TABS (PREDNISONE) 4 tabs for 2 days, then 3 tabs for 2 days, 2 tabs for 2 days, then 1 tab for 2 days, then stop  #20 x 0   Entered and Authorized by:   Rubye Oaks NP   Signed by:   Jonise Weightman NP on 06/28/2010   Method used:   Electronically to        [facility]. [address] [phone]* (retail)       [address] [place], [place]  [id number]       Ph: [phone]       Fax: [phone]   RxID:   [id number]

## 2010-07-13 ENCOUNTER — Other Ambulatory Visit: Payer: Self-pay | Admitting: Physician Assistant

## 2010-07-13 DIAGNOSIS — C439 Malignant melanoma of skin, unspecified: Secondary | ICD-10-CM

## 2010-07-13 HISTORY — DX: Malignant melanoma of skin, unspecified: C43.9

## 2010-07-20 ENCOUNTER — Encounter: Payer: Self-pay | Admitting: Cardiovascular Disease

## 2010-07-21 ENCOUNTER — Ambulatory Visit: Payer: Medicare Other | Admitting: Sports Medicine

## 2010-07-26 ENCOUNTER — Ambulatory Visit (INDEPENDENT_AMBULATORY_CARE_PROVIDER_SITE_OTHER): Payer: Medicare Other | Admitting: Gastroenterology

## 2010-07-26 ENCOUNTER — Encounter: Payer: Self-pay | Admitting: Gastroenterology

## 2010-07-26 DIAGNOSIS — Z86010 Personal history of colonic polyps: Secondary | ICD-10-CM

## 2010-07-26 DIAGNOSIS — K3189 Other diseases of stomach and duodenum: Secondary | ICD-10-CM

## 2010-07-27 ENCOUNTER — Other Ambulatory Visit: Payer: Self-pay | Admitting: Physician Assistant

## 2010-07-27 NOTE — Letter (Signed)
Summary: Self-Recorded Meds  Self-Recorded Meds   Imported By: Marylou Mccoy 07/20/2010 08:29:19  _____________________________________________________________________  External Attachment:    Type:   Image     Comment:   External Document

## 2010-07-28 ENCOUNTER — Ambulatory Visit (INDEPENDENT_AMBULATORY_CARE_PROVIDER_SITE_OTHER): Payer: Medicare Other | Admitting: Sports Medicine

## 2010-07-28 ENCOUNTER — Encounter: Payer: Self-pay | Admitting: Sports Medicine

## 2010-07-28 DIAGNOSIS — M7660 Achilles tendinitis, unspecified leg: Secondary | ICD-10-CM

## 2010-07-29 ENCOUNTER — Ambulatory Visit: Payer: Medicare Other | Admitting: Emergency Medicine

## 2010-08-03 NOTE — Assessment & Plan Note (Signed)
GI problem list: 1.  Personal history of tubular adenomas.  Colonoscopy 3/07 with small TAs, next recall 07/2010 2.  Schatzki's ring, dilated to 20 mm with a balloon during EGD August, 2009. Very difficult IV access patient needed PICC line placed prior to this procedure 3.  GERD: Rather severe symptoms, spring 2010 symptoms controlled on twice daily PPI as well as bedtime H2 blocker. 4.  Gastritis: reactive, noted on EGD above, biopsies showed no H. pylori.  History of Present Illness Visit Type: Follow-up Visit Primary GI MD: Rob Bunting MD Primary Provider: Kelle Darting, MD Chief Complaint: chest pain non-cardiac/ epigastric pain History of Present Illness:      this very pleasant 70 year old man whom I last saw about 2 years ago.  has had left chest pains, dull.  A discomfort, "very subtle senstion" last 30-60 seconds.  a 3/10 on severity, migrated around his chest.  This has been improving.  Was recommended to take prednisone for chest wall pain, didn't take them.   also has had "heartburn," Changed his diet around: has cut out citris foods, fatty foods, less butter.  has been on ibuprofen and also alleve 2 nightly.  for ankle pains, then for chest pains.   he stopped taking the above month and a half ago. Around that time his pains started to improve.            Current Medications (verified): 1)  Nizoral 200 Mg Tabs (Ketoconazole) .... As Needed 2)  Tenormin 50 Mg  Tabs (Atenolol) .... Qam 3)  Losartan Potassium 25 Mg Tabs (Losartan Potassium) .Marland Kitchen.. 1 By Mouth Once Daily 4)  Maxzide 75-50 Mg  Tabs (Triamterene-Hctz) .... Qam 5)  Flexeril 10 Mg  Tabs (Cyclobenzaprine Hcl) .... Qhs 6)  Relafen 500 .... Take 1 Tablet By Mouth Two Times A Day 7)  Flonase 50 Mcg/act  Susp (Fluticasone Propionate) .... 2 Puffs Qd 8)  Zyrtec Allergy 10 Mg Tabs (Cetirizine Hcl) .... Once Daily 9)  Allopurinol 300 Mg  Tabs (Allopurinol) .... 1/2 Tab Once Daily 10)  Proair Hfa 108 (90 Base)  Mcg/act  Aers (Albuterol Sulfate) .... Take One To Two Puffs As Needed 11)  Lipitor 40 Mg  Tabs (Atorvastatin Calcium) .Marland Kitchen.. 1 Tab @ Bedtime 12)  Protonix 40 Mg  Tbec (Pantoprazole Sodium) .Marland Kitchen.. 1 Pill Twice Daily (20-30 Min Prior To Breakfast and [employer]) 13)  Saw Palmetto 1000 Mg Caps (Saw Palmetto (Serenoa Repens)) .... One Tab Two Times A Day 14)  Glucosamine-Chondroitin 500-400 Mg Caps (Glucosamine-Chondroitin) .... Take One Tab Three Times A Day 15)  Levitra 20 Mg Tabs (Vardenafil Hcl) .... Uad 16)  Spiriva Handihaler 18 Mcg Caps (Tiotropium Bromide Monohydrate) .... Use Once Daily 17)  Potassium Chloride Crys Cr 20 Meq Cr-Tabs (Potassium Chloride Crys Cr) .... Take 1 Tablet By Mouth Every Morning 18)  Amitriptyline Hcl 25 Mg Tabs (Amitriptyline Hcl) .Marland Kitchen.. 1 Tab At Bedtime For Sleep 19)  Norvasc 5 Mg Tabs (Amlodipine Besylate) .... Take One Tablet Daily 20)  Nitroglycerin 0.2 Mg/hr Pt24 (Nitroglycerin) .... Use 1/4 Patch Daily To Your Achille's Tendon.  Change Daily  Allergies (verified): 1)  ! Codeine  Vital Signs:  Patient profile:   70 year old male Height:      70 inches Weight:      187.13 pounds BMI:     26.95 Pulse rate:   72 / minute Pulse rhythm:   regular BP sitting:   118 / 70  (left arm) Cuff size:  regular  Vitals Entered By: June McMurray CMA Duncan Dull) (July 26, 2010 10:37 AM)  Physical Exam  Additional Exam:  Constitutional: generally well appearing Psychiatric: alert and oriented times 3 Abdomen: soft, non-tender, non-distended, normal bowel sounds    Impression & Recommendations:  Problem # 1:   Dyspepsia , left chest pain  first his left sided chest pains are probably not GI related. he was having some dyspepsia, epigastric discomfort, indigestion around the same time and I suspect these are related to some NSAID that he needed for his migratory chest pains and a left ankle issue. he has since stopped taking all the NSAIDs and his pains have begun to  improve. I do not think we need to do any endoscopy at this point.  Problem # 2:   personal history of adenomatous polyps  we will schedule him for colonoscopy, he is due for followup around now. He has terrible IV access and we will again had a PICC line placed prior to his colonoscopy.  Patient Instructions: 1)  You will be scheduled to have a colonoscopy at [facility].  He will need a PICC line placed prior to the procedure for severe IV access problems, +propofol. 2)  Continue to avoid NSAIDs unless you absolutely need them. 3)  The medication list was reviewed and reconciled.  All changed / newly prescribed medications were explained.  A complete medication list was provided to the patient / caregiver.  Appended Document:  pt will be called to schedule the Colon and PICC line the first of May

## 2010-08-03 NOTE — Assessment & Plan Note (Signed)
Summary: FU LEFT ACHILLES/MJD   Vital Signs:  Patient profile:   70 year old male Pulse rate:   76 / minute BP sitting:   104 / 69  (left arm)  Vitals Entered By: Rochele Pages RN (July 28, 2010 9:21 AM) CC: f/u lt achilles tendonitis   Referring Provider:  Arvilla Meres Primary Provider:  Kelle Darting, MD  CC:  f/u lt achilles tendonitis.  History of Present Illness: 70 yo M f/u Lt Achille's tendinopathy.  Has been on NTG protocol for 2.5 months.  Improved by about 30% pain wise. Mainly here because he did well with patch for 2 months, but over last 1-2 weeks has had severely increased dizziness and low BP.  Currently on 5 BP meds, denies recent changes. Lowest BP 88/47 recently. Doing his HEP with eccentrics two times a day. Overall, can now touch AT with less pain.  Preventive Screening-Counseling & Management  Alcohol-Tobacco     Smoking Status: quit  Allergies: 1)  ! Codeine  Physical Exam  General:  Well-developed,well-nourished,in no acute distress; alert,appropriate and cooperative throughout examination Msk:  Lt ankle: AT still with obvious thickening/nodule, but appears smaller than in past.  Less ttp.  Good flexibility.  Nl strength. Neg Thompson's.  MSK [place] Lt AT: diameter still 1.3 cm with obvious disarray of fibers, multiple instrasubstance tears.  Multiple areas of calcification.  +++ neovessels with excellent doppler flow in areas of largest tear. Neurologic:  alert & oriented X3.     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 he will then call to let us know how he is doing, or he can go to every other day use of patch if still causing problems - continue HEP two times a day, don't push past pain level of 3/10 - already has heel lifts - f/u 6 weeks for repeat US  scan  Orders: [place] LIMITED ([id number])  Complete Medication List: 1)  Nizoral 200 Mg Tabs (Ketoconazole) .... As needed 2)  Tenormin 50 Mg Tabs (Atenolol) .... Qam 3)  Losartan Potassium 25 Mg Tabs (Losartan potassium) .Marland Kitchen.. 1 by mouth once daily 4)  Maxzide 75-50 Mg Tabs (Triamterene-hctz) .... Qam 5)  Flexeril 10 Mg Tabs (Cyclobenzaprine hcl) .... Qhs 6)  Relafen 500  .... Take 1 tablet by mouth two times a day 7)  Flonase 50 Mcg/act Susp (Fluticasone propionate) .... 2 puffs qd 8)  Zyrtec Allergy 10 Mg Tabs (Cetirizine hcl) .... Once daily 9)  Allopurinol 300 Mg Tabs (Allopurinol) .... 1/2 tab once daily 10)  Proair Hfa 108 (90 Base) Mcg/act Aers (Albuterol sulfate) .... Take one to two puffs as needed 11)  Lipitor 40 Mg Tabs (Atorvastatin calcium) .Marland Kitchen.. 1 tab @ bedtime 12)  Protonix 40 Mg Tbec (Pantoprazole sodium) .Marland Kitchen.. 1 pill twice daily (20-30 min prior to breakfast and dinner meals) 13)  Saw Palmetto 1000 Mg Caps (Saw palmetto (serenoa repens)) .... One tab two times a day 14)  Glucosamine-chondroitin 500-400 Mg Caps (Glucosamine-chondroitin) .... Take one tab three times a day 15)  Levitra 20 Mg Tabs (Vardenafil hcl) .... Uad 16)  Spiriva Handihaler 18 Mcg Caps (Tiotropium bromide monohydrate) .... Use once daily 17)  Potassium Chloride Crys Cr 20 Meq Cr-tabs (Potassium chloride crys cr) .... Take 1 tablet by mouth every morning 18)  Amitriptyline Hcl  25 Mg Tabs (Amitriptyline hcl) .Marland Kitchen.. 1 tab at bedtime for sleep 19)  Norvasc 5 Mg Tabs (Amlodipine besylate) .... Take one tablet daily 20)  Nitroglycerin 0.2 Mg/hr Pt24 (Nitroglycerin) .... Use 1/4 patch daily to your achille's tendon.  change daily 21)  Minitran 0.1 Mg/hr Pt24 (Nitroglycerin) .... Apply 1/4 of a patch to the affected area every 24hours Prescriptions: MINITRAN 0.1 MG/HR PT24 (NITROGLYCERIN) apply 1/4 of a patch to the affected area every 24hours  #30 x 2   Entered by:   Lillia Pauls CMA   Authorized by:   Enid Baas MD    Signed by:   Lillia Pauls CMA on 07/28/2010   Method used:   Electronically to        [facility]. [address] [phone]* (retail)       [address] [place], [place]  [id number]       Ph: [phone]       Fax: [phone]   RxID:   [id number]    Orders Added: 1)  Est. Patient Level III [93235] 2)  [place] LIMITED [[id number]]

## 2010-08-09 ENCOUNTER — Telehealth: Payer: Self-pay

## 2010-08-09 DIAGNOSIS — Z860100 Personal history of colon polyps, unspecified: Secondary | ICD-10-CM

## 2010-08-09 DIAGNOSIS — Z452 Encounter for adjustment and management of vascular access device: Secondary | ICD-10-CM

## 2010-08-09 NOTE — Telephone Encounter (Signed)
Message copied by Chales Abrahams on Mon Aug 09, 2010  9:25 AM ------      Message from: Chales Abrahams      Created: Mon Jul 26, 2010 11:08 AM       Pt needs PICC line and Colon with propofol MAY 3 pt request

## 2010-08-09 NOTE — Telephone Encounter (Signed)
Pt was scheduled for Colon at [facility] on 08/19/10 1030 am pt needs PICC line placed the morning before but IR doesn't have any available spots.  It can be done the day before.  Dr Christella Hartigan is that ok?

## 2010-08-10 ENCOUNTER — Other Ambulatory Visit: Payer: Self-pay | Admitting: Gastroenterology

## 2010-08-10 DIAGNOSIS — C210 Malignant neoplasm of anus, unspecified: Secondary | ICD-10-CM

## 2010-08-10 MED ORDER — PEG-KCL-NACL-NASULF-NA ASC-C 100 G PO SOLR: 1.0000 | ORAL | Status: DC

## 2010-08-10 NOTE — Telephone Encounter (Signed)
Yes, that is ok with me as long as IR understands he will be going home the day of his procedure.  Also, they will need to be able to pull the line out after his procedure is over (I don't think this takes much time, they could probably send someone over to endo to do it, but I would verify it is ok with them.)

## 2010-08-10 NOTE — Telephone Encounter (Signed)
Addended by: Chales Abrahams on: 08/10/2010 02:08 PM   Modules accepted: Orders

## 2010-08-19 ENCOUNTER — Other Ambulatory Visit: Payer: Medicare Other | Admitting: Gastroenterology

## 2010-09-09 ENCOUNTER — Ambulatory Visit (INDEPENDENT_AMBULATORY_CARE_PROVIDER_SITE_OTHER): Payer: Medicare Other | Admitting: Sports Medicine

## 2010-09-09 DIAGNOSIS — M7660 Achilles tendinitis, unspecified leg: Secondary | ICD-10-CM

## 2010-09-09 MED ORDER — NITROGLYCERIN 0.2 MG/HR TD PT24: MEDICATED_PATCH | TRANSDERMAL | Status: DC

## 2010-09-09 NOTE — Assessment & Plan Note (Signed)
There is good improvement in his symptoms since he has been able to restart the use of nitroglycerin. He was able to do the exercises on 2 feet but has not been able to do 1 foot yet We will try to get him to at a few 1 foot heel raises for the left Achilles  Continue to use the nitroglycerin and with his extensive tendon breakdown we may wind up using this for a full year  Make sure and use heel lifts in all of his shoes particularly when he goes back to fishing  Recheck in 2 months  Time spent with evaluation 30 minutes

## 2010-09-09 NOTE — Progress Notes (Signed)
  Subjective:    Patient ID: [name], male    DOB: 1941-04-06, 70 y.o.   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 A.  nodule starts about 2 cm above the Achilles attachment and extends to 8 cm above the calcaneus Doppler flow in the vessels are extensive throughout this entire nodule and have increased since we started the treatment with nitroglycerin On longitudinal and transverse scans there are a number of hypoechoic changes suggesting partial tears and tenderness breakdown throughout this region   Assessment & Plan:

## 2010-09-16 ENCOUNTER — Other Ambulatory Visit: Payer: Medicare Other | Admitting: Gastroenterology

## 2010-09-16 ENCOUNTER — Ambulatory Visit (HOSPITAL_COMMUNITY)
Admission: RE | Admit: 2010-09-16 | Discharge: 2010-09-16 | Disposition: A | Discharge status: Home / Self Care | Payer: Medicare Other | Source: Ambulatory Visit | Attending: Gastroenterology | Admitting: Gastroenterology

## 2010-09-16 ENCOUNTER — Other Ambulatory Visit: Payer: Self-pay | Admitting: Gastroenterology

## 2010-09-16 DIAGNOSIS — Z860100 Personal history of colon polyps, unspecified: Secondary | ICD-10-CM | POA: Insufficient documentation

## 2010-09-16 DIAGNOSIS — K219 Gastro-esophageal reflux disease without esophagitis: Secondary | ICD-10-CM | POA: Insufficient documentation

## 2010-09-16 DIAGNOSIS — K644 Residual hemorrhoidal skin tags: Secondary | ICD-10-CM

## 2010-09-16 DIAGNOSIS — K5730 Diverticulosis of large intestine without perforation or abscess without bleeding: Secondary | ICD-10-CM | POA: Insufficient documentation

## 2010-09-16 DIAGNOSIS — D126 Benign neoplasm of colon, unspecified: Secondary | ICD-10-CM

## 2010-09-16 DIAGNOSIS — J4489 Other specified chronic obstructive pulmonary disease: Secondary | ICD-10-CM | POA: Insufficient documentation

## 2010-09-16 DIAGNOSIS — Z452 Encounter for adjustment and management of vascular access device: Secondary | ICD-10-CM

## 2010-09-16 DIAGNOSIS — I10 Essential (primary) hypertension: Secondary | ICD-10-CM | POA: Insufficient documentation

## 2010-09-16 DIAGNOSIS — J449 Chronic obstructive pulmonary disease, unspecified: Secondary | ICD-10-CM | POA: Insufficient documentation

## 2010-09-16 DIAGNOSIS — K449 Diaphragmatic hernia without obstruction or gangrene: Secondary | ICD-10-CM | POA: Insufficient documentation

## 2010-10-01 NOTE — Assessment & Plan Note (Signed)
[facility]                             PULMONARY OFFICE NOTE   NAME:SEGERSONCalab, Mark                    MRN:          [id number]  DATE:06/07/2006                            DOB:          [birth year]    SUBJECTIVE:  [name] is a 70 year old gentleman with COPD who  follows up today for a regularly scheduled visit.  At our last  appointment in November he restarted his Spiriva.  He had been having  difficulty tolerating both Spiriva and a long acting beta-agonist due to  side effects involving dry mouth and throat burning and possible thrush.  Since he restarted the Spiriva he has been doing more complete mouth  rinsing after dose administration.  He says that he is tolerating the  medication much better now.  He has had 1 episode of burning in his  mouth that was treated with antifungals in early January, otherwise he  has done well.  His exertional tolerance has improved since he was  restarted on bronchodilators.  He complains of a persistent sore throat  and daily nasal congestion that appears to be stable.  He has not had  any hoarse voice.   MEDICATIONS:  1. Norvasc 7.5 mg daily.  2. Altace 2.5 mg daily.  3. Tenormin 50 mg daily.  4. Maxzide 75/50 mg daily.  5. Prilosec 20 mg daily.  6. Zyrtec D daily.  7. Lipitor 40 mg daily.  8. Flexeril 10 mg daily.  9. Flonase 2 sprays each nostril daily.  10.Saw-Palmetto.  11.Glucosamine 500 mg b.i.d.  12.Relafen 500 mg b.i.d.  13.Spiriva 1 inhalation daily.  14.Albuterol 2 puffs q. 4 hours p.r.n. for shortness of breath.   EXAMINATION:  GENERAL:  This is a pleasant well-appearing gentleman who  is in no distress on room air.  His weight is 197 pounds.  Temperature  98.4.  Blood pressure 122/74, heart rate 72.  SPO2 94% on room air.  HEENT:  Is benign.  I do not see any significant posterior pharyngeal  erythema.  His neck is without strider.  LUNGS:  Are distant but clear.  HEART:  Has a  regular rate and rhythm without murmur.  ABDOMEN:  Is benign.  EXTREMITIES:  He has residual left upper extremity changes, status post  prior injury in surgery.  There is no significant edema.   IMPRESSION:  1. Chronic obstructive pulmonary disease that appears to be improved      now that he is back on bronchodilators.  He is tolerating Spiriva      in conjunction with frequent mouth rinsing.  2. Chronic sore throat, likely related to his allergic rhinitis.   PLANS:  1. Continue Spiriva as ordered and mouth rinsing as he is currently      doing.  2. I will see him in 6 months for a regularly scheduled followup with      spirometry to assess his clinical and physiological status.  3. With regard to his sore throat, we discussed possible referral to      ENT if this persists.  He would like to defer  this referral at this      time and we will continue to treat his allergic rhinitis with      Zyrtec and Flonase.     Leslye Peer, MD  Electronically Signed    RSB/MedQ  DD: 06/07/2006  DT: 06/07/2006  Job #: [id number]   cc:   Rachael Fee, MD  Eugenio Hoes Tawanna Cooler, MD  Gordy Savers, MD  Mark Buckles. Bensimhon, MD

## 2010-10-01 NOTE — Assessment & Plan Note (Signed)
[facility]                               PULMONARY OFFICE NOTE   JDYN, PARKERSON                    MRN:          [id number]  DATE:04/10/2006                            DOB:          07/07/1940    SUBJECTIVE:  Mr. [name] is a 70 year old gentleman with COPD.  He has been  doing fairly well but we have had significant difficulty with finding a  bronchodilator regimen that he could tolerate due to side effects.  In  particular, he has had burning in his mouth and possible thrush on various  medications including Spiriva, Foradil and Atrovent.  We stopped all of  those medications back in October to let his mouth and throat recover.  He  now says he is not experiencing an upper airway symptoms and is ready to try  a new regimen.  Since our last visit his breathing has been a little worse,  with some trade off in function in absence of his bronchodilators.   CURRENT MEDICATIONS:  1. Norvasc 7.5 mg daily.  2. Altace 2.5 mg daily.  3. Tenormin 50 mg daily.  4. Maxzide 75/50 mg daily.  5. Prilosec 20 mg daily.  6. Zyrtec D daily.  7. Lipitor 20 mg daily.  8. Flexeril 10 mg daily.  9. Flonase on spray at each nostril daily.  10.Saw Palmetto daily.  11.Glucosamine 500 mg b.i.d.  12.Relafen 500 mg b.i.d.  13.Albuterol two puffs q.4h. p.r.n. for shortness of breath.   EXAM:  GENERAL:  This is a pleasant, well-appearing, gentleman in no  distress.  Weight is 194 pounds, which is up 7 pounds from our last visit.  His temperature is 97.8, blood pressure 128/78, heart rate 73, SPO2 97% on  room air.  OROPHARYNX:  Clear, there is no evidence of thrush.  LUNGS:  Clear to auscultation bilaterally.  There is no wheezing on forced  expiration.  HEART:  Regular rate and rhythm without murmur.  ABDOMEN:  Soft, benign.  EXTREMITIES:  He has residual left upper extremity deficit from past trauma,  otherwise is within normal limits.   IMPRESSION:  1.  Chronic obstructive pulmonary disease.  2. Side effects from his HFAs and bronchodilators possibly resulting in      thrush, although I believe that this is mucosal dryness and      inflammation.   PLANS:  Mr. Lave is willing to go back on Spiriva once daily.  He will  try to rinse his mouth well after using and stay hydrated.  Will follow up  in 2 months to see if he is able to tolerate this medication.  He will  continue to use his albuterol on an as needed basis.     Leslye Peer, MD  Electronically Signed    RSB/MedQ  DD: 04/10/2006  DT: 04/10/2006  Job #: [id number]

## 2010-10-01 NOTE — H&P (Signed)
[name], [name]                       ACCOUNT NO.:  [id number]   MEDICAL RECORD NO.:  [id number]                   PATIENT TYPE:  INP   LOCATION:  NA                                   FACILITY:  [facility]   PHYSICIAN:  Maretta Bees. Vonita Moss, M.D.             DATE OF BIRTH:  November 29, 1940   DATE OF ADMISSION:  03/13/2002  DATE OF DISCHARGE:                                HISTORY & PHYSICAL   HISTORY:  This 70 year old white male has had a long history of voiding  symptoms with frequency, nocturia.  Cardura and Hytrin have been tried in  the past but he got side effects.  He also has been on Flomax but side  effects also occurred and he has had continued problems with voiding.  Although the Flomax helped him, he could not tolerate the malaise and blood  pressure elevation.  He had a prostate ultrasound measuring 25 cc and he had  a long option about observation, Flomax, and the feeling that Proscar would  not help because of the small size of his prostate but he opted for  interventional procedure.  He was tried on Proscar in the meantime but he  still has voiding symptoms and is admitted today for TUR of the prostate and  bladder neck.  He has a history of very poor venous access and also wants to  have spinal anesthesia.  He was advised about bleeding, scarring, retrograde  ejaculation.  He does have a history of sexual dysfunction and does not  tolerate Viagra well.   PAST MEDICAL HISTORY:  Hypertension and GERD.   MEDICATIONS:  1. Norvasc 7.5 mg q.d.  2. Altace 2.5 mg q.d.  3. Maxzide 75/50 mg daily.  4. Prilosec 20 mg daily.  5. Zyrtec D q.12h.  6. Lipitor 20 mg at bedtime for hypercholesterolemia.  7. Relafen 500 mg b.i.d.  8. Cyclobenzoprine 10 mg at bedtime.  9. Potassium 20 mEq daily.  10.      Tenormin 50 mg daily.   ALLERGIES:  He gets a rash from CODEINE and cannot tolerate VIAGRA or ALPHA  BLOCKERS.   SOCIAL HISTORY:  He does not drink alcohol.  He has no current  substance  abuse.  He smokes two cigars a week.   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  He has had two kidney stones  before and tonsillectomy and some type of description of a blood flow  problems to his kidneys as a child but the problem is unclear.   REVIEW OF SYSTEMS:  Found on health history form.   PHYSICAL EXAMINATION:  VITAL SIGNS:  Blood pressure 120/70, pulse 74,  temperature 97.3.  GENERAL:  He is alert and oriented.  SKIN:  Warm and dry.  He has very poor veins.  CHEST:  Clear.  HEART:  Tones are regular.  ABDOMEN:  Soft and nontender.  GENITOURINARY:  External genitalia normal.  Prostate feels benign.   IMPRESSION:  1. Bladder outlet obstruction and prostatism.  2. Hypertension.  3. Gastroesophageal reflux disease.  4. Hypercholesterolemia.  5. Fasciitis and arthritis.   PLAN:  Cystoscopy and TUR prostate and bladder neck.                                               Maretta Bees. Vonita Moss, M.D.    LJP/MEDQ  D:  03/13/2002  T:  03/13/2002  Job:  [id number]

## 2010-10-01 NOTE — Op Note (Signed)
NAMEBERTRAND, VOWELS                       ACCOUNT NO.:  [id number]   MEDICAL RECORD NO.:  [id number]                   PATIENT TYPE:  INP   LOCATION:  NA                                   FACILITY:  [facility]   PHYSICIAN:  Maretta Bees. Vonita Moss, M.D.             DATE OF BIRTH:  23-Jan-1941   DATE OF PROCEDURE:  03/13/2002  DATE OF DISCHARGE:                                 OPERATIVE REPORT   PREOPERATIVE DIAGNOSES:  Benign prostatic hypertrophy and bladder outlet  obstruction.   POSTOPERATIVE DIAGNOSES:  Benign prostatic hypertrophy and bladder outlet  obstruction.   PROCEDURE:  Cystoscopy and transurethral resection of the prostate and  bladder neck.   SURGEON:  Maretta Bees. Vonita Moss, M.D.   ANESTHESIA:  Spinal.   INDICATIONS FOR PROCEDURE:  This 70 year old gentleman has had a long  history of bladder outlet obstructive symptoms and has been helped by Flomax  and Cardura, but he could not tolerate them due to the side effects.  His  prostate only measured 25 gm and did not expect to respond to Proscar.  Because of continued symptoms, he is brought to the OR today for TUR of the  bladder neck and prostate.   PROCEDURE:  The patient was brought to the operating room and after  anesthesia, we got access through a neck vein due to poor peripheral veins.  He was brought into the operating suite, and spinal anesthesia was induced.  The external genitalia were prepped and draped in the usual fashion.  He was  sounded from 68 to 65 [place] with [employer].  A 28-French  resectoscope sheath was inserted, and the bladder had trabeculation but no  stones or tumors.  On pulling back on the prostate, the bladder neck really  tightened down and went into spasm.  I then resected the bladder neck  initially at 6 o'clock, and this spun the bladder neck wide open and  resected further sides of the bladder neck and then resected prostatic  tissue around the capsule and thus created a  well-excavated prostatic fossa.  Hemostasis was obtained with the use of electrocautery.  Residual chips were  removed from the bladder.  Hemostasis was secured, and the ureteral orifices  were intact, and the cystoscope was removed.  The external sphincter was  seen to be intact.  A 24-French 30 cc Foley was inserted and connected to  closed drainage after placing 40 cc in the balloon.  A B&O suppository was  inserted per rectum.  He was taken to the recovery room in good condition  with neglible blood loss, having tolerated the procedure well.                                               Maretta Bees. Vonita Moss, M.D.  LJP/MEDQ  D:  03/13/2002  T:  03/13/2002  Job:  [id number]

## 2010-10-01 NOTE — Discharge Summary (Signed)
   [name], [name]                       ACCOUNT NO.:  [id number]   MEDICAL RECORD NO.:  [id number]                   PATIENT TYPE:  INP   LOCATION:  0382                                 FACILITY:  [facility]   PHYSICIAN:  Maretta Bees. Vonita Moss, M.D.             DATE OF BIRTH:  [birth year]   DATE OF ADMISSION:  03/13/2002  DATE OF DISCHARGE:  03/15/2002                                 DISCHARGE SUMMARY   FINAL DIAGNOSES:  1. Benign prostatic hypertrophy and bladder neck contracture, final     pathology pending.  2. Hypertension.  3. Gastroesophageal reflux disease.  4. Hypercholesterolemia.  5. Fascitis and arthritis.   PROCEDURE:  Cystoscopy and transurethral resection of prostate bladder neck  on 03/13/02.   HISTORY OF PRESENT ILLNESS:  This 70 year old gentleman has had a long  history of bladder outlet obstructive symptoms, and was helped by Flomax,  but Flomax and other alpha blockers caused intolerable side effects.  His  prostate is small, and Proscar did not help him.  He is brought to the  hospital for correction of his voiding symptomatology.   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  RECTAL:  Small benign feeling prostate.   HOSPITAL COURSE:  After admission, he underwent surgery as noted above.  He  had a benign postoperative course.  His IV access which was difficult to  place and eventually placed in the neck was removed one day postoperatively.  He was tolerating diet well.  His Foley was removed on 03/15/02, and he  voided with a good stream after that, and was sent home that same day.   ACTIVITY:  Limited activity for a month.   DIET:  As tolerated.   CONDITION ON DISCHARGE:  Good.   DISCHARGE MEDICATIONS:  1. Norvasc.  2. Altace.  3. Maxzide.  4. Prilosec.  5. Zyrtec.  6. Lipitor.  7. Relafen.  8.     Cyclobenzaprine.  9. Potassium.  10.      Tenormin.             Maretta Bees. Vonita Moss, M.D.    LJP/MEDQ  D:  03/15/2002  T:  03/15/2002  Job:  [id number]

## 2010-11-23 ENCOUNTER — Ambulatory Visit: Payer: Medicare Other | Admitting: Sports Medicine

## 2010-12-07 ENCOUNTER — Encounter: Payer: Self-pay | Admitting: Cardiovascular Disease

## 2010-12-21 ENCOUNTER — Other Ambulatory Visit: Payer: Self-pay | Admitting: Physician Assistant

## 2010-12-23 ENCOUNTER — Ambulatory Visit (INDEPENDENT_AMBULATORY_CARE_PROVIDER_SITE_OTHER): Payer: Medicare Other | Admitting: Sports Medicine

## 2010-12-23 VITALS — BP 131/80

## 2010-12-23 DIAGNOSIS — M7660 Achilles tendinitis, unspecified leg: Secondary | ICD-10-CM

## 2010-12-23 MED ORDER — NITROGLYCERIN 0.2 MG/HR TD PT24: MEDICATED_PATCH | TRANSDERMAL | Status: DC

## 2010-12-23 NOTE — Patient Instructions (Signed)
COntinue the patches for the next 3 months Continue the exercises Continue the hell lifts Come back in 3-4 months.

## 2010-12-23 NOTE — Assessment & Plan Note (Signed)
Improving Continue nitro patches refilled prescription Given new heel lifts for shoes Continue exercises and stretches daily  Come back for re-scan in 3-4 months.

## 2010-12-23 NOTE — Progress Notes (Signed)
  Subjective:    Patient ID: [name], male    DOB: 1941-01-29, 70 y.o.   MRN: [id number]  HPI   Pt presents to clinic for f/u of lt achilles tendonitis Been on nitro patch for the last 2 months and total of about 6 months.  Really does not have much pain at all only with fishing sometimes which causes stress on it due [place]. Able to do activities of daily living without trouble.  Continues exercises at least once daily, icing afterwards   Review of Systems  As above Past medical history, social, surgical and family history all reviewed.      Objective:   Physical Exam  Bilat calf significant varicosities and fibrous nodular change Lt at nodule starting at 3 cm up to 6cm above calcaneal insertion- not tender to squeezing Calf strength is fairly equal today Able to walk without a limp  MSK ultrasound The left Achilles tendon is scanned from the insertion of the calcaneus up to 12 cm above. A.  nodule starts about 3 cm above the Achilles attachment and extends to 7 cm above the calcaneus Doppler flow shows decrease in neovascularization from previous scan still using the nitro patches On longitudinal and transverse scans there are a number of hypoechoic changes suggesting partial tears with now calcifications noted. Non tender. Mild interval improvement Nodule is about 1.16 Cms in AP width  Assessment & Plan:  ACHILLES TENDINITIS Improving Continue nitro patches refilled prescription Given new heel lifts for shoes Continue exercises and stretches daily  Come back for re-scan in 3-4 months.

## 2011-02-04 ENCOUNTER — Telehealth: Payer: Self-pay | Admitting: *Deleted

## 2011-02-04 NOTE — Telephone Encounter (Signed)
Wants to know if he should have his pneumonia vaccine repeated from 2006.

## 2011-02-07 NOTE — Telephone Encounter (Signed)
Left message on machine for patient  To return our call 

## 2011-02-10 NOTE — Telephone Encounter (Signed)
patient  Is aware 

## 2011-03-29 ENCOUNTER — Encounter: Payer: Self-pay | Admitting: Sports Medicine

## 2011-03-29 ENCOUNTER — Ambulatory Visit (INDEPENDENT_AMBULATORY_CARE_PROVIDER_SITE_OTHER): Payer: Medicare Other | Admitting: Sports Medicine

## 2011-03-29 VITALS — BP 124/74 | HR 58

## 2011-03-29 DIAGNOSIS — M7660 Achilles tendinitis, unspecified leg: Secondary | ICD-10-CM

## 2011-03-29 NOTE — Progress Notes (Signed)
  Subjective:    Patient ID: [name], male    DOB: 1941-04-07, 70 y.o.   MRN: [id number]  HPI  FOLLOWUP L ACHILLES TENDINOPATHY - Presents for f/u of lt achilles tendonitis  - Last seen in August here -- was improving at that time on nitro patches - He has been on nitro patches for 9 months  - Has some discomfort in the achilles region - Worse with a period inactivity followed by a activity - Overall he feels 80% better from the onset of the pain - Feels somewhat improved from the last visit - Able to do activities of daily living without trouble.   Review of Systems No fevers/chills/wt loss    Objective:   Physical Exam  Gen: NAD  MSK: Inspection: Bilat calf significant varicosities and fibrous nodular change   Palpation: Lt at nodule starting at 3 cm up to 6cm above calcaneal insertion- not tender to squeezing   Strength: 5/5 PF/DF   Gait: Ambulates without a limp   MSK Ultrasound  The left achilles tendon was visualized and shows a defect in the central aspect of the tendon. The tendon measures 1.3cm in diameter. The defect measures 0.8X2.5 cm. These were compared to prior exam and shows interval improvement.  There is a significant reduction of his increased Doppler flow to the split areas of the tendon       Assessment & Plan:   L ACHILLES TENDINOPATHY - Given new heel lifts - Will have him wean nitro patches at this point - RTC in 3-4 months or if symptoms return

## 2011-03-29 NOTE — Patient Instructions (Signed)
Nitro Patch weaning  Go to every other day for 2-3 weeks   Then  Go to every third day for 2-3 weeks  Let us know if your symptoms are worsening during the weaning.

## 2011-03-29 NOTE — Assessment & Plan Note (Signed)
There is a central split in the tendon but continues to get smaller  He needs to try to continue the exercises even though his pain is much less  I think there may still be some potential for this healing over time  Recheck in 4 months

## 2011-04-26 ENCOUNTER — Other Ambulatory Visit: Payer: Self-pay | Admitting: Family Medicine

## 2011-04-26 NOTE — Telephone Encounter (Signed)
Pt states he is requesting this mediation because he gets thrush every so often in his mouth.  Pt would like rx sent to [facility] in [place].

## 2011-06-27 ENCOUNTER — Encounter: Payer: Medicare Other | Admitting: Family Medicine

## 2011-06-30 ENCOUNTER — Ambulatory Visit (INDEPENDENT_AMBULATORY_CARE_PROVIDER_SITE_OTHER): Payer: Medicare Other | Admitting: Family Medicine

## 2011-06-30 ENCOUNTER — Encounter: Payer: Self-pay | Admitting: Family Medicine

## 2011-06-30 DIAGNOSIS — N529 Male erectile dysfunction, unspecified: Secondary | ICD-10-CM

## 2011-06-30 DIAGNOSIS — M545 Low back pain, unspecified: Secondary | ICD-10-CM

## 2011-06-30 DIAGNOSIS — N400 Enlarged prostate without lower urinary tract symptoms: Secondary | ICD-10-CM

## 2011-06-30 DIAGNOSIS — J45909 Unspecified asthma, uncomplicated: Secondary | ICD-10-CM

## 2011-06-30 DIAGNOSIS — K219 Gastro-esophageal reflux disease without esophagitis: Secondary | ICD-10-CM

## 2011-06-30 DIAGNOSIS — J309 Allergic rhinitis, unspecified: Secondary | ICD-10-CM

## 2011-06-30 DIAGNOSIS — J4489 Other specified chronic obstructive pulmonary disease: Secondary | ICD-10-CM

## 2011-06-30 DIAGNOSIS — M1990 Unspecified osteoarthritis, unspecified site: Secondary | ICD-10-CM

## 2011-06-30 DIAGNOSIS — I10 Essential (primary) hypertension: Secondary | ICD-10-CM

## 2011-06-30 DIAGNOSIS — M1A9XX1 Chronic gout, unspecified, with tophus (tophi): Secondary | ICD-10-CM

## 2011-06-30 DIAGNOSIS — E785 Hyperlipidemia, unspecified: Secondary | ICD-10-CM

## 2011-06-30 DIAGNOSIS — J449 Chronic obstructive pulmonary disease, unspecified: Secondary | ICD-10-CM

## 2011-06-30 DIAGNOSIS — Z23 Encounter for immunization: Secondary | ICD-10-CM

## 2011-06-30 LAB — CBC WITH DIFFERENTIAL/PLATELET
Basophils Absolute: 0 10*3/uL (ref 0.0–0.1)
Basophils Relative: 0.6 % (ref 0.0–3.0)
Eosinophils Relative: 3.3 % (ref 0.0–5.0)
HCT: 49.7 % (ref 39.0–52.0)
Hemoglobin: 16.7 g/dL (ref 13.0–17.0)
Lymphocytes Relative: 22.3 % (ref 12.0–46.0)
Lymphs Abs: 1.8 10*3/uL (ref 0.7–4.0)
Monocytes Relative: 7 % (ref 3.0–12.0)
Neutro Abs: 5.5 10*3/uL (ref 1.4–7.7)
RBC: 5.42 Mil/uL (ref 4.22–5.81)
RDW: 13.5 % (ref 11.5–14.6)

## 2011-06-30 LAB — BASIC METABOLIC PANEL
BUN: 27 mg/dL — ABNORMAL HIGH (ref 6–23)
CO2: 27 mEq/L (ref 19–32)
Chloride: 103 mEq/L (ref 96–112)
GFR: 58.06 mL/min — ABNORMAL LOW (ref >60.00)
Glucose, Bld: 86 mg/dL (ref 70–99)
Potassium: 4.4 mEq/L (ref 3.5–5.1)
Sodium: 140 mEq/L (ref 135–145)

## 2011-06-30 LAB — POCT URINALYSIS DIPSTICK
Bilirubin, UA: NEGATIVE
Blood, UA: NEGATIVE
Glucose, UA: NEGATIVE
Ketones, UA: NEGATIVE
Spec Grav, UA: 1.02
pH, UA: 7

## 2011-06-30 LAB — LIPID PANEL
HDL: 45.1 mg/dL (ref >39.00)
LDL Cholesterol: 108 mg/dL — ABNORMAL HIGH (ref 0–99)
Total CHOL/HDL Ratio: 4
Triglycerides: 95 mg/dL (ref 0.0–149.0)

## 2011-06-30 LAB — HEPATIC FUNCTION PANEL
AST: 31 U/L (ref 0–37)
Total Bilirubin: 0.8 mg/dL (ref 0.3–1.2)

## 2011-06-30 LAB — PSA: PSA: 0.66 ng/mL (ref 0.10–4.00)

## 2011-06-30 MED ORDER — NABUMETONE 500 MG PO TABS: 500.0000 mg | ORAL_TABLET | Freq: Two times a day (BID) | ORAL | Status: DC

## 2011-06-30 MED ORDER — ATORVASTATIN CALCIUM 40 MG PO TABS: 40.0000 mg | ORAL_TABLET | Freq: Every evening | ORAL | Status: DC

## 2011-06-30 MED ORDER — PANTOPRAZOLE SODIUM 40 MG PO TBEC: 40.0000 mg | DELAYED_RELEASE_TABLET | Freq: Two times a day (BID) | ORAL | Status: DC

## 2011-06-30 MED ORDER — ALBUTEROL SULFATE HFA 108 (90 BASE) MCG/ACT IN AERS: 1.0000 | INHALATION_SPRAY | Freq: Two times a day (BID) | RESPIRATORY_TRACT | Status: DC | PRN

## 2011-06-30 MED ORDER — VARDENAFIL HCL 20 MG PO TABS: 20.0000 mg | ORAL_TABLET | Freq: Every day | ORAL | Status: DC | PRN

## 2011-06-30 MED ORDER — TIOTROPIUM BROMIDE MONOHYDRATE 18 MCG IN CAPS: 18.0000 ug | ORAL_CAPSULE | Freq: Every day | RESPIRATORY_TRACT | Status: DC

## 2011-06-30 MED ORDER — ATENOLOL 50 MG PO TABS: 50.0000 mg | ORAL_TABLET | Freq: Every day | ORAL | Status: DC

## 2011-06-30 MED ORDER — AMITRIPTYLINE HCL 25 MG PO TABS: 25.0000 mg | ORAL_TABLET | Freq: Every day | ORAL | Status: DC

## 2011-06-30 MED ORDER — CYCLOBENZAPRINE HCL 10 MG PO TABS: 10.0000 mg | ORAL_TABLET | Freq: Every day | ORAL | Status: DC

## 2011-06-30 MED ORDER — NITROGLYCERIN 0.2 MG/HR TD PT24: MEDICATED_PATCH | TRANSDERMAL | Status: DC

## 2011-06-30 MED ORDER — LOSARTAN POTASSIUM 25 MG PO TABS: 25.0000 mg | ORAL_TABLET | Freq: Every day | ORAL | Status: DC

## 2011-06-30 MED ORDER — TRIAMTERENE-HCTZ 75-50 MG PO TABS: 1.0000 | ORAL_TABLET | Freq: Every day | ORAL | Status: DC

## 2011-06-30 MED ORDER — AMLODIPINE BESYLATE 5 MG PO TABS: 5.0000 mg | ORAL_TABLET | Freq: Every day | ORAL | Status: DC

## 2011-06-30 MED ORDER — KETOCONAZOLE 200 MG PO TABS: 200.0000 mg | ORAL_TABLET | Freq: Every day | ORAL | Status: DC

## 2011-06-30 MED ORDER — ALLOPURINOL 300 MG PO TABS: 150.0000 mg | ORAL_TABLET | Freq: Every day | ORAL | Status: DC

## 2011-06-30 MED ORDER — POTASSIUM CHLORIDE 20 MEQ PO PACK: 20.0000 meq | PACK | Freq: Every day | ORAL | Status: DC

## 2011-06-30 MED ORDER — FLUTICASONE PROPIONATE 50 MCG/ACT NA SUSP: 2.0000 | Freq: Every day | NASAL | Status: DC

## 2011-06-30 NOTE — Patient Instructions (Signed)
Continue your current medications.  Return in one year or sooner if any problems 

## 2011-06-30 NOTE — Progress Notes (Signed)
  Subjective:    Patient ID: [name], male    DOB: 08/14/1940, 71 y.o.   MRN: [id number]  HPI Bertie is a 71 year old married male nonsmoker who comes in today for evaluation of multiple problems a Medicare wellness examination  His medications were reviewed in detail and there have been no changes C. the med list  He gets routine eye care, hearing diminished, regular dental care, recent colonoscopy, tetanus 2007, flu shot 2012, Pneumovax 2006, information given on shingles  Cognitive function normal he walks on a daily basis home health safety reviewed no issues identified, no guns in the house, he does have a health care power of attorney and living will.    Review of Systems  Constitutional: Negative.   HENT: Negative.   Eyes: Negative.   Respiratory: Negative.   Cardiovascular: Negative.   Gastrointestinal: Negative.   Genitourinary: Negative.   Musculoskeletal: Negative.   Skin: Negative.   Neurological: Negative.   Hematological: Negative.   Psychiatric/Behavioral: Negative.        Objective:   Physical Exam  Constitutional: He is oriented to person, place, and time. He appears well-developed and well-nourished.  HENT:  Head: Normocephalic and atraumatic.  Right Ear: External ear normal.  Left Ear: External ear normal.  Nose: Nose normal.  Mouth/Throat: Oropharynx is clear and moist.  Eyes: Conjunctivae and EOM are normal. Pupils are equal, round, and reactive to light.  Neck: Normal range of motion. Neck supple. No JVD present. No tracheal deviation present. No thyromegaly present.  Cardiovascular: Normal rate, regular rhythm and intact distal pulses.  Exam reveals no gallop and no friction rub.   No murmur heard.      Distant because of underlying COPD  Pulmonary/Chest: Effort normal. No stridor. No respiratory distress. He has no wheezes. He has no rales. He exhibits no tenderness.       Diminished breath sounds bilaterally  Abdominal: Soft. Bowel sounds  are normal. He exhibits no distension and no mass. There is no tenderness. There is no rebound and no guarding.  Genitourinary: Rectum normal, prostate normal and penis normal. Guaiac negative stool. No penile tenderness.  Musculoskeletal: Normal range of motion. He exhibits no edema and no tenderness.  Lymphadenopathy:    He has no cervical adenopathy.  Neurological: He is alert and oriented to person, place, and time. He has normal reflexes. No cranial nerve deficit. He exhibits normal muscle tone.  Skin: Skin is warm and dry. No rash noted. No erythema. No pallor.       Scars on both arms from previous history of drug use  Psychiatric: He has a normal mood and affect. His behavior is normal. Judgment and thought content normal.          Assessment & Plan:  History of gout continue allopurinol daily  Hypertension continue current medications  Hyperlipidemia continue Lipitor 40 daily and an aspirin tablet  Allergic rhinitis continue Zyrtec and Flonase nasal spray  Underlying COPD continue Spiriva and albuterol when necessary  BPH  History of erectile dysfunction

## 2011-07-01 ENCOUNTER — Telehealth: Payer: Self-pay | Admitting: *Deleted

## 2011-07-01 ENCOUNTER — Other Ambulatory Visit: Payer: Self-pay | Admitting: *Deleted

## 2011-07-01 MED ORDER — ZOSTER VACCINE LIVE 19400 UNT/0.65ML ~~LOC~~ SOLR: 0.6500 mL | Freq: Once | SUBCUTANEOUS | Status: AC

## 2011-07-01 NOTE — Telephone Encounter (Signed)
Opened in ERROR

## 2011-07-01 NOTE — Telephone Encounter (Signed)
Shingles vaccine rx sent to pharmacy

## 2011-07-14 ENCOUNTER — Other Ambulatory Visit: Payer: Self-pay | Admitting: Family Medicine

## 2011-08-03 ENCOUNTER — Other Ambulatory Visit: Payer: Self-pay | Admitting: Physician Assistant

## 2011-09-06 ENCOUNTER — Telehealth: Payer: Self-pay | Admitting: *Deleted

## 2011-09-06 ENCOUNTER — Telehealth: Payer: Self-pay | Admitting: Family Medicine

## 2011-09-06 DIAGNOSIS — K219 Gastro-esophageal reflux disease without esophagitis: Secondary | ICD-10-CM

## 2011-09-06 DIAGNOSIS — M545 Low back pain, unspecified: Secondary | ICD-10-CM

## 2011-09-06 DIAGNOSIS — N529 Male erectile dysfunction, unspecified: Secondary | ICD-10-CM

## 2011-09-06 DIAGNOSIS — J309 Allergic rhinitis, unspecified: Secondary | ICD-10-CM

## 2011-09-06 DIAGNOSIS — E785 Hyperlipidemia, unspecified: Secondary | ICD-10-CM

## 2011-09-06 DIAGNOSIS — I10 Essential (primary) hypertension: Secondary | ICD-10-CM

## 2011-09-06 DIAGNOSIS — M1A9XX1 Chronic gout, unspecified, with tophus (tophi): Secondary | ICD-10-CM

## 2011-09-06 DIAGNOSIS — M1990 Unspecified osteoarthritis, unspecified site: Secondary | ICD-10-CM

## 2011-09-06 DIAGNOSIS — N400 Enlarged prostate without lower urinary tract symptoms: Secondary | ICD-10-CM

## 2011-09-06 DIAGNOSIS — J449 Chronic obstructive pulmonary disease, unspecified: Secondary | ICD-10-CM

## 2011-09-06 DIAGNOSIS — J4489 Other specified chronic obstructive pulmonary disease: Secondary | ICD-10-CM

## 2011-09-06 MED ORDER — CYCLOBENZAPRINE HCL 10 MG PO TABS: 10.0000 mg | ORAL_TABLET | Freq: Every day | ORAL | Status: DC

## 2011-09-06 MED ORDER — FLUTICASONE PROPIONATE 50 MCG/ACT NA SUSP: 2.0000 | Freq: Every day | NASAL | Status: DC

## 2011-09-06 NOTE — Telephone Encounter (Signed)
Pt is asking to speak to Fleet Contras re: problems with medications, and needs help.  Please call pt directly so he can explain what is going on.

## 2011-09-06 NOTE — Telephone Encounter (Addendum)
Pt needs 3 nasal sprays of flonase for 90 day supply  With 3 refills not 12 refills call into [facility] [place] [phone]

## 2011-09-06 NOTE — Telephone Encounter (Signed)
Patient requesting a 90 day for his refills

## 2011-09-15 ENCOUNTER — Encounter: Payer: Self-pay | Admitting: Family Medicine

## 2011-09-15 ENCOUNTER — Ambulatory Visit (INDEPENDENT_AMBULATORY_CARE_PROVIDER_SITE_OTHER): Payer: Medicare Other | Admitting: Family Medicine

## 2011-09-15 DIAGNOSIS — E785 Hyperlipidemia, unspecified: Secondary | ICD-10-CM

## 2011-09-15 DIAGNOSIS — K219 Gastro-esophageal reflux disease without esophagitis: Secondary | ICD-10-CM

## 2011-09-15 DIAGNOSIS — N400 Enlarged prostate without lower urinary tract symptoms: Secondary | ICD-10-CM

## 2011-09-15 DIAGNOSIS — L723 Sebaceous cyst: Secondary | ICD-10-CM | POA: Insufficient documentation

## 2011-09-15 DIAGNOSIS — N529 Male erectile dysfunction, unspecified: Secondary | ICD-10-CM

## 2011-09-15 DIAGNOSIS — M1A9XX1 Chronic gout, unspecified, with tophus (tophi): Secondary | ICD-10-CM

## 2011-09-15 DIAGNOSIS — I10 Essential (primary) hypertension: Secondary | ICD-10-CM

## 2011-09-15 DIAGNOSIS — J449 Chronic obstructive pulmonary disease, unspecified: Secondary | ICD-10-CM

## 2011-09-15 NOTE — Patient Instructions (Signed)
Set up a time in February for your annual exam  Nonfasting labs one week prior

## 2011-09-15 NOTE — Progress Notes (Signed)
  Subjective:    Patient ID: [name], male    DOB: 06/05/1940, 71 y.o.   MRN: [id number]  HPI [name] is a 71 year old married male nonsmoker who comes in today for evaluation of 2 lumps on his body  He has a lump on the lateral portion of his right knee and his left arm. They seem to increase in size and then gets smaller.   Review of Systems Dermatologic review of systems otherwise negative    Objective:   Physical Exam  Well-developed well-nourished male in no acute distress the areas in question are too small 0.5" x 0.5" sebaceous cyst this soft rubbery and movable      Assessment & Plan:  Sebaceous cyst x2 observe

## 2011-10-03 ENCOUNTER — Other Ambulatory Visit: Payer: Self-pay | Admitting: Family Medicine

## 2011-11-21 ENCOUNTER — Ambulatory Visit (INDEPENDENT_AMBULATORY_CARE_PROVIDER_SITE_OTHER): Payer: Medicare Other | Admitting: Family Medicine

## 2011-11-21 ENCOUNTER — Encounter: Payer: Self-pay | Admitting: Family Medicine

## 2011-11-21 VITALS — BP 120/80 | Temp 98.0°F | Wt 184.0 lb

## 2011-11-21 DIAGNOSIS — G245 Blepharospasm: Secondary | ICD-10-CM | POA: Insufficient documentation

## 2011-11-21 NOTE — Progress Notes (Signed)
  Subjective:    Patient ID: [name], male    DOB: 19-Aug-1940, 71 y.o.   MRN: [id number]  HPI [name] is a 71 year old male who comes in today for evaluation of 2 problems  About 3 weeks ago he noticed a fluttering sensation in his left eyelid it lasts for about 10-15 seconds and goes away it comes and goes. He went to see his optometrist Dr. Lawerance Bach who did a thorough eye exam and told it was normal. He's also had a fluttering sensation in his left ear that's unrelated to the fluttering sensation in his left eyelid. He went to ENT and they checked his ear was normal.   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

## 2011-11-21 NOTE — Patient Instructions (Addendum)
Return when necessary 

## 2012-04-27 ENCOUNTER — Ambulatory Visit (INDEPENDENT_AMBULATORY_CARE_PROVIDER_SITE_OTHER): Payer: Medicare Other | Admitting: Adult Health

## 2012-04-27 ENCOUNTER — Encounter: Payer: Self-pay | Admitting: Adult Health

## 2012-04-27 VITALS — BP 117/71 | HR 71 | Temp 98.5°F | Ht 67.0 in | Wt 182.0 lb

## 2012-04-27 DIAGNOSIS — J329 Chronic sinusitis, unspecified: Secondary | ICD-10-CM

## 2012-04-27 MED ORDER — LEVOFLOXACIN 500 MG PO TABS: ORAL_TABLET | ORAL | Status: DC

## 2012-04-27 MED ORDER — HYDROCODONE-HOMATROPINE 5-1.5 MG/5ML PO SYRP: 5.0000 mL | ORAL_SOLUTION | Freq: Three times a day (TID) | ORAL | Status: DC | PRN

## 2012-04-27 NOTE — Progress Notes (Signed)
Subjective:    Patient ID: [name], male    DOB: 01/24/1941, 71 y.o.   MRN: [id number]  HPI  Very pleasant 71 y/o male patient of Dr. Tawanna Cooler with a hx of HTN, HLD, COPD who presents to clinic today with c/o productive cough notably worse at night. Reports sputum is "yellowish". Pt has tried several OTC products including Equate cold/sinus and robitussin without any relief of symptoms. Symptoms began ~ 1 week ago when he returned from a football game in [place]. He recalls, that while in the airport, there was a woman with a "terrible cough". Pt feels symptoms are getting worse. Positive for cough, congestion (he has been taking Afrin nasal spray for over 1 year). Denies fever, chills, rhinorrhea, chest pain or worsening shortness of breath. Note that pt has ongoing SOB 2/2 his COPD. Patient does not smoke. He did receive his flu shot this year.   Current Outpatient Prescriptions on File Prior to Visit  Medication Sig Dispense Refill  . allopurinol (ZYLOPRIM) 300 MG tablet Take 0.5 tablets (150 mg total) by mouth daily.  100 tablet  3  . amitriptyline (ELAVIL) 25 MG tablet Take 1 tablet (25 mg total) by mouth at bedtime.  100 tablet  3  . amLODipine (NORVASC) 5 MG tablet Take 1 tablet (5 mg total) by mouth daily.  100 tablet  3  . aspirin 81 MG tablet Take 81 mg by mouth daily.      Marland Kitchen atenolol (TENORMIN) 50 MG tablet Take 1 tablet (50 mg total) by mouth daily.  100 tablet  3  . atorvastatin (LIPITOR) 40 MG tablet Take 1 tablet (40 mg total) by mouth every evening.  100 tablet  3  . cetirizine (ZYRTEC) 10 MG tablet Take 10 mg by mouth daily.        . cyclobenzaprine (FLEXERIL) 10 MG tablet Take 1 tablet (10 mg total) by mouth at bedtime.  90 tablet  3  . fluticasone (FLONASE) 50 MCG/ACT nasal spray Place 2 sprays into the nose daily. Each nostril.  48 g  3  . ketoconazole (NIZORAL) 200 MG tablet Take 1 tablet (200 mg total) by mouth daily.  30 tablet  2  . losartan (COZAAR) 25 MG tablet  Take 1 tablet (25 mg total) by mouth daily.  100 tablet  3  . nabumetone (RELAFEN) 500 MG tablet take 1 tablet by mouth twice a day  200 tablet  4  . pantoprazole (PROTONIX) 40 MG tablet Take 1 tablet (40 mg total) by mouth 2 (two) times daily.  100 tablet  3  . potassium chloride (KLOR-CON) 20 MEQ packet Take 20 mEq by mouth daily.  100 tablet  3  . POTASSIUM PO Take 20 mEq by mouth daily.        Marland Kitchen PROAIR HFA 108 (90 BASE) MCG/ACT inhaler inhale 1 to 2 puffs daily if needed  8.5 g  3  . SAW PALMETTO, SERENOA REPENS, PO Take 1 capsule by mouth daily.        Marland Kitchen tiotropium (SPIRIVA) 18 MCG inhalation capsule Place 1 capsule (18 mcg total) into inhaler and inhale daily.  100 capsule  3  . triamterene-hydrochlorothiazide (MAXZIDE) 75-50 MG per tablet Take 1 tablet by mouth daily after breakfast.  100 tablet  3  . vardenafil (LEVITRA) 20 MG tablet Take 1 tablet (20 mg total) by mouth daily as needed for erectile dysfunction.  6 tablet  11      Review of Systems  Constitutional: Negative for fever, chills, activity change, appetite change and fatigue.  HENT: Positive for congestion and postnasal drip. Negative for ear pain, sore throat, rhinorrhea, neck pain and ear discharge.   Eyes: Negative.   Respiratory: Positive for cough and shortness of breath. Negative for chest tightness and wheezing.   Cardiovascular: Negative for chest pain and leg swelling.  Gastrointestinal: Negative for nausea, vomiting and diarrhea.  Musculoskeletal: Negative.   Skin: Negative for pallor and rash.  Neurological: Negative for dizziness, weakness, light-headedness and headaches.  Psychiatric/Behavioral: Negative.     BP 117/71  Pulse 71  Temp 98.5 F (36.9 C) (Oral)  Ht 5\' 7"  (1.702 m)  Wt 182 lb (82.555 kg)  BMI 28.51 kg/m2  SpO2 95%     Objective:   Physical Exam  Constitutional: He is oriented to person, place, and time. He appears well-developed and well-nourished. No distress.  HENT:  Head:  Normocephalic.  Right Ear: External ear normal.  Left Ear: External ear normal.  Mouth/Throat: No oropharyngeal exudate.       Oropharynx  erythematous. Post nasal drip.  Eyes: Conjunctivae normal are normal. Right eye exhibits no discharge. No scleral icterus.  Neck: Normal range of motion. No tracheal deviation present.  Cardiovascular: Normal rate, regular rhythm and normal heart sounds.   No murmur heard. Pulmonary/Chest: Effort normal. No respiratory distress. He has no wheezes. He has no rales.       Rhonchi bilateral LL; clears with coughing. Shortness of breath with exertion.  Abdominal: Soft. There is no tenderness.  Musculoskeletal: Normal range of motion.  Lymphadenopathy:    He has no cervical adenopathy.  Neurological: He is alert and oriented to person, place, and time.  Skin: Skin is warm and dry.  Psychiatric: He has a normal mood and affect. His behavior is normal. Judgment and thought content normal.       Assessment & Plan:

## 2012-04-27 NOTE — Patient Instructions (Addendum)
  Please call if your symptoms do not improve in the next 2-3 days. I have sent your prescription to your pharmacy for Levaquin. Take it 2 times daily for 7 days. If you are still not feeling completely recovered, then continue to take the remaining 3 days.   Have a wonderful Christmas and New Year!

## 2012-04-27 NOTE — Assessment & Plan Note (Signed)
Congestion, post nasal drip, productive cough greater than 7 days with symptoms not improving. Start Levaquin. Continue Flonase. Ordered Hycodan cough liquid. Note that patient is allergic to codeine but he can tolerate hydrocodone. Discussed rebound congestion often experienced with prolonged use of Afrin. Instructed patient to call if symptoms do not improve within the next 2-3 days. May need a prednisone taper; although, he is hoping this is not the case as he reports prednisone causes extremely agitation.

## 2012-04-30 ENCOUNTER — Ambulatory Visit: Payer: Medicare Other | Admitting: Family Medicine

## 2012-05-29 ENCOUNTER — Telehealth: Payer: Self-pay | Admitting: Emergency Medicine

## 2012-05-29 NOTE — Telephone Encounter (Signed)
Called and spoke with patient. Last saw Dr. Delton Coombes in 2011, saw Tammy P in 07/2010. Pt states that he is interested in starting Pulmonary Rehab at [facility]. Pt needs to re-establish with Dr. Delton Coombes and they can discuss referral to Pulmonary Rehab at this appointment. Appointment with Dr. Delton Coombes scheduled for Wed. 05/30/12. Rhonda J Cobb

## 2012-05-30 ENCOUNTER — Ambulatory Visit (INDEPENDENT_AMBULATORY_CARE_PROVIDER_SITE_OTHER)
Admission: RE | Admit: 2012-05-30 | Discharge: 2012-05-30 | Disposition: A | Discharge status: Home / Self Care | Payer: Medicare PPO | Source: Ambulatory Visit | Attending: Emergency Medicine | Admitting: Emergency Medicine

## 2012-05-30 ENCOUNTER — Encounter: Payer: Self-pay | Admitting: Emergency Medicine

## 2012-05-30 ENCOUNTER — Ambulatory Visit (INDEPENDENT_AMBULATORY_CARE_PROVIDER_SITE_OTHER): Payer: Medicare PPO | Admitting: Emergency Medicine

## 2012-05-30 VITALS — BP 138/82 | HR 66 | Temp 98.0°F | Ht 70.0 in | Wt 185.0 lb

## 2012-05-30 DIAGNOSIS — J449 Chronic obstructive pulmonary disease, unspecified: Secondary | ICD-10-CM

## 2012-05-30 NOTE — Patient Instructions (Addendum)
Please continue your Spiriva daily Use albuterol as needed We will refer you for pulmonary rehab at [place] CXR today We will perform pulmonary function testing at your next office visit [facility] with Dr Delton Coombes in 2 months with full PFT

## 2012-05-30 NOTE — Progress Notes (Signed)
72 yo man with COPD, allergic rhinitis, GERD.   ROV 06/29/09 -- regular f/u for COPD. Has had some URI signs last several days. Having cough, scant sputum. Some nasal congestion. Not really manifesting itself as a change in his breathing, wheeze. Has been using mucinex. Overall breathing has been stable, he doesn't feels that he needs any addition to his maintanance regimen.   June 28, 2010 --Presents for an acute office visit.Complains of a dull ache in upper left chest wall x11months, described as "not acute, just there." He was seen by cardiology couple of weeks ago felt to be noncardiac pain. Also seen by PCP ?musculoskeletal pain. Says over last several weeks his breathing not as good as normal , has had to use rescue inhaler more often. NO calf pain or recent travel. Some wheezing intermittently.Denies orthopnea, hemoptysis, fever, n/v/d, edema, headache.  Describes as fleeting chest pain-dull at times. Non exertional. No pain on inspiration. Intermittently tender  Has increased reflux despite two times a day PPI. Has used Advil on /off but not on consitstent basis with some help.  ROV 05/30/12 -- hx COPD, allergies, GERD. Last seen by me in 2011. Returns to reestablish care. Remains on Spiriva qd, rarely uses SABA. He is having more difficulty exercising. He recently did a 6 minute walk at [place] and desaturated after 4 minutes. He wants to start pulm rehab. Denies mucous production, minimal wheeze, rare cough. He has flared once since I've seen him - recommended pred but he didn't take because poorly tolerated. Then treated with levaquin for bronchitis in 12/13.   Filed Vitals:   05/30/12 1346  BP: 138/82  Pulse: 66  Temp: 98 F (36.7 C)   Gen: Pleasant, well-nourished, in no distress,  normal affect  ENT: No lesions,  mouth clear,  oropharynx clear, no postnasal drip  Neck: No JVD, no TMG, no carotid bruits  Lungs: No use of accessory muscles, no dullness to percussion, clear  without rales or rhonchi  Cardiovascular: RRR, heart sounds normal, no murmur or gallops, no peripheral edema  Musculoskeletal: No deformities, no cyanosis or clubbing  Neuro: alert, non focal  Skin: Warm, no lesions or rashes  COPD - continue Spiriva - albuterol prn - PFT at rov - will refer to [facility] for pulm rehab - consider additional BD in future - need to establish whether he needs o2 w exertion - test him at pulm rehab - rov in 1 month

## 2012-05-30 NOTE — Assessment & Plan Note (Addendum)
-   continue Spiriva - albuterol prn - PFT at rov - CXR today - will refer to [facility] for pulm rehab - consider additional BD in future - need to establish whether he needs o2 w exertion - test him at pulm rehab - rov in 1 month

## 2012-06-03 ENCOUNTER — Encounter: Payer: Self-pay | Admitting: Family Medicine

## 2012-06-06 ENCOUNTER — Other Ambulatory Visit: Payer: Self-pay | Admitting: Family Medicine

## 2012-06-11 ENCOUNTER — Ambulatory Visit (INDEPENDENT_AMBULATORY_CARE_PROVIDER_SITE_OTHER): Payer: Medicare PPO | Admitting: Family Medicine

## 2012-06-11 ENCOUNTER — Telehealth: Payer: Self-pay | Admitting: Gastroenterology

## 2012-06-11 ENCOUNTER — Encounter: Payer: Self-pay | Admitting: Family Medicine

## 2012-06-11 VITALS — BP 120/80 | Temp 98.1°F | Wt 186.0 lb

## 2012-06-11 DIAGNOSIS — J449 Chronic obstructive pulmonary disease, unspecified: Secondary | ICD-10-CM

## 2012-06-11 MED ORDER — PREDNISONE 20 MG PO TABS: ORAL_TABLET | ORAL | Status: DC

## 2012-06-11 MED ORDER — HYDROCODONE-HOMATROPINE 5-1.5 MG/5ML PO SYRP: ORAL_SOLUTION | ORAL | Status: DC

## 2012-06-11 NOTE — Telephone Encounter (Signed)
Recall updated in my chart

## 2012-06-11 NOTE — Progress Notes (Signed)
  Subjective:    Patient ID: [name], male    DOB: 12/31/1940, 72 y.o.   MRN: [id number]  HPIed is a 72 year old male married ex-smoker who comes in today for cough  He is followed in pulmonary by Dr. Ortencia Kick. He's due to start pulmonary rehabilitation soon because at rest his pulse ox is 96 however with exertion his pulse ox drops to 83.  He states for the past 2 months he's had a cough it's gotten worse. He has no fever sputum production. At one point he saw a nurse practitioner at [facility] who put him on Levaquin. However he had no signs of infection. I explained to add that the antibiotics would be for a bacterial infection only.   Review of Systems Review of systems otherwise negative    Objective:   Physical Exam Well-developed well-nourished male in no acute distress vital signs stable BP 120/80 lungs exam shows symmetrical decrease in breath sounds no wheezing cardiac exam normal except for distant heart sounds and COPD       Assessment & Plan:  COPD with cough plan prednisone burst and taper

## 2012-06-11 NOTE — Patient Instructions (Signed)
Take the prednisone as directed......... 2 tabs for 3 days or until your cough diminishes significantly and then taper as outlined  Hydromet 1/2-1 teaspoon at bedtime when necessary for nighttime cough

## 2012-06-12 LAB — PULMONARY FUNCTION TEST

## 2012-06-18 ENCOUNTER — Encounter: Payer: Medicare Other | Admitting: Family Medicine

## 2012-06-22 ENCOUNTER — Telehealth: Payer: Self-pay | Admitting: Gastroenterology

## 2012-06-22 NOTE — Telephone Encounter (Signed)
Noted  

## 2012-06-25 ENCOUNTER — Other Ambulatory Visit (INDEPENDENT_AMBULATORY_CARE_PROVIDER_SITE_OTHER): Payer: Medicare PPO

## 2012-06-25 DIAGNOSIS — I10 Essential (primary) hypertension: Secondary | ICD-10-CM

## 2012-06-25 DIAGNOSIS — N400 Enlarged prostate without lower urinary tract symptoms: Secondary | ICD-10-CM

## 2012-06-25 DIAGNOSIS — E785 Hyperlipidemia, unspecified: Secondary | ICD-10-CM

## 2012-06-25 LAB — CBC WITH DIFFERENTIAL/PLATELET
Eosinophils Absolute: 0.3 10*3/uL (ref 0.0–0.7)
MCHC: 34 g/dL (ref 30.0–36.0)
MCV: 89.1 fl (ref 78.0–100.0)
Monocytes Absolute: 0.6 10*3/uL (ref 0.1–1.0)
Neutrophils Relative %: 62.4 % (ref 43.0–77.0)
Platelets: 230 10*3/uL (ref 150.0–400.0)
RDW: 13.7 % (ref 11.5–14.6)
WBC: 7.9 10*3/uL (ref 4.5–10.5)

## 2012-06-25 LAB — POCT URINALYSIS DIPSTICK
Leukocytes, UA: NEGATIVE
Protein, UA: NEGATIVE
Urobilinogen, UA: 0.2

## 2012-06-25 LAB — HEPATIC FUNCTION PANEL
ALT: 13 U/L (ref 0–53)
AST: 28 U/L (ref 0–37)
Alkaline Phosphatase: 79 U/L (ref 39–117)
Bilirubin, Direct: 0.1 mg/dL (ref 0.0–0.3)
Total Protein: 6.8 g/dL (ref 6.0–8.3)

## 2012-06-25 LAB — LIPID PANEL: HDL: 37.3 mg/dL — ABNORMAL LOW (ref >39.00)

## 2012-06-25 LAB — BASIC METABOLIC PANEL
BUN: 22 mg/dL (ref 6–23)
CO2: 30 mEq/L (ref 19–32)
Calcium: 9.2 mg/dL (ref 8.4–10.5)
Creatinine, Ser: 1.3 mg/dL (ref 0.4–1.5)

## 2012-06-25 LAB — TSH: TSH: 1.83 u[IU]/mL (ref 0.35–5.50)

## 2012-06-25 LAB — LDL CHOLESTEROL, DIRECT: Direct LDL: 95.2 mg/dL

## 2012-07-02 ENCOUNTER — Encounter: Payer: Self-pay | Admitting: Family Medicine

## 2012-07-02 ENCOUNTER — Ambulatory Visit (INDEPENDENT_AMBULATORY_CARE_PROVIDER_SITE_OTHER): Payer: Medicare PPO | Admitting: Family Medicine

## 2012-07-02 VITALS — BP 124/80 | Temp 97.6°F | Ht 70.0 in | Wt 185.0 lb

## 2012-07-02 DIAGNOSIS — N529 Male erectile dysfunction, unspecified: Secondary | ICD-10-CM

## 2012-07-02 DIAGNOSIS — J4489 Other specified chronic obstructive pulmonary disease: Secondary | ICD-10-CM

## 2012-07-02 DIAGNOSIS — M545 Low back pain, unspecified: Secondary | ICD-10-CM

## 2012-07-02 DIAGNOSIS — I10 Essential (primary) hypertension: Secondary | ICD-10-CM

## 2012-07-02 DIAGNOSIS — M1990 Unspecified osteoarthritis, unspecified site: Secondary | ICD-10-CM

## 2012-07-02 DIAGNOSIS — N400 Enlarged prostate without lower urinary tract symptoms: Secondary | ICD-10-CM

## 2012-07-02 DIAGNOSIS — J309 Allergic rhinitis, unspecified: Secondary | ICD-10-CM

## 2012-07-02 DIAGNOSIS — J45909 Unspecified asthma, uncomplicated: Secondary | ICD-10-CM

## 2012-07-02 DIAGNOSIS — J449 Chronic obstructive pulmonary disease, unspecified: Secondary | ICD-10-CM

## 2012-07-02 DIAGNOSIS — K219 Gastro-esophageal reflux disease without esophagitis: Secondary | ICD-10-CM

## 2012-07-02 DIAGNOSIS — M1A9XX1 Chronic gout, unspecified, with tophus (tophi): Secondary | ICD-10-CM

## 2012-07-02 DIAGNOSIS — E785 Hyperlipidemia, unspecified: Secondary | ICD-10-CM

## 2012-07-02 MED ORDER — ALBUTEROL SULFATE HFA 108 (90 BASE) MCG/ACT IN AERS: 1.0000 | INHALATION_SPRAY | RESPIRATORY_TRACT | Status: DC | PRN

## 2012-07-02 MED ORDER — FLUTICASONE PROPIONATE 50 MCG/ACT NA SUSP: 2.0000 | Freq: Every day | NASAL | Status: DC

## 2012-07-02 MED ORDER — LOSARTAN POTASSIUM 25 MG PO TABS: 25.0000 mg | ORAL_TABLET | Freq: Every day | ORAL | Status: DC

## 2012-07-02 MED ORDER — CYCLOBENZAPRINE HCL 10 MG PO TABS: 10.0000 mg | ORAL_TABLET | Freq: Every day | ORAL | Status: DC

## 2012-07-02 MED ORDER — NABUMETONE 500 MG PO TABS: ORAL_TABLET | ORAL | Status: DC

## 2012-07-02 MED ORDER — TIOTROPIUM BROMIDE MONOHYDRATE 18 MCG IN CAPS: 18.0000 ug | ORAL_CAPSULE | Freq: Every day | RESPIRATORY_TRACT | Status: DC

## 2012-07-02 MED ORDER — ALLOPURINOL 300 MG PO TABS: 150.0000 mg | ORAL_TABLET | Freq: Every day | ORAL | Status: DC

## 2012-07-02 MED ORDER — PANTOPRAZOLE SODIUM 40 MG PO TBEC: 40.0000 mg | DELAYED_RELEASE_TABLET | Freq: Two times a day (BID) | ORAL | Status: DC

## 2012-07-02 MED ORDER — ATENOLOL 50 MG PO TABS: 50.0000 mg | ORAL_TABLET | Freq: Every day | ORAL | Status: DC

## 2012-07-02 MED ORDER — ATORVASTATIN CALCIUM 40 MG PO TABS: 40.0000 mg | ORAL_TABLET | Freq: Every evening | ORAL | Status: DC

## 2012-07-02 NOTE — Progress Notes (Signed)
  Subjective:    Patient ID: [name], male    DOB: 1940-11-14, 72 y.o.   MRN: [id number]  HPI [name] is a 72 year old male married nonsmoker who comes in today for a Medicare wellness examination because of a history of gout, neuropathy, hypertension, hyperlipidemia, allergic rhinitis, chronic low back pain, osteoarthritis, reflux esophagitis, COPD, BPH with outlet structure and, erectile dysfunction  His medication reviewed in detail and there've been no changes  He gets routine eye care, dental care, colonoscopy 2012 normal, vaccinations up-to-date  Cognitive functions normal he walks on a regular basis home health safety reviewed no issues identified, no guns in the house, he does have a health care power of attorney and living well   Review of Systems  Constitutional: Negative.   HENT: Negative.   Eyes: Negative.   Respiratory: Negative.   Cardiovascular: Negative.   Gastrointestinal: Negative.   Genitourinary: Negative.   Musculoskeletal: Negative.   Skin: Negative.   Neurological: Negative.   Psychiatric/Behavioral: Negative.        Objective:   Physical Exam  Constitutional: He is oriented to person, place, and time. He appears well-developed and well-nourished.  HENT:  Head: Normocephalic and atraumatic.  Right Ear: External ear normal.  Left Ear: External ear normal.  Nose: Nose normal.  Mouth/Throat: Oropharynx is clear and moist.  Eyes: Conjunctivae and EOM are normal. Pupils are equal, round, and reactive to light.  Neck: Normal range of motion. Neck supple. No JVD present. No tracheal deviation present. No thyromegaly present.  Cardiovascular: Normal rate, regular rhythm, normal heart sounds and intact distal pulses.  Exam reveals no gallop and no friction rub.   No murmur heard. Pulmonary/Chest: Effort normal. No stridor. No respiratory distress. He has no wheezes. He has no rales. He exhibits no tenderness.  Decreased breath sounds symmetrical no wheezing   Abdominal: Soft. Bowel sounds are normal. He exhibits no distension and no mass. There is no tenderness. There is no rebound and no guarding.  Genitourinary: Rectum normal and penis normal. Guaiac negative stool. No penile tenderness.  History of TURP..... Prostate very small nonnodular  Musculoskeletal: Normal range of motion. He exhibits no edema and no tenderness.  Deformity left hand chronic  Lymphadenopathy:    He has no cervical adenopathy.  Neurological: He is alert and oriented to person, place, and time. He has normal reflexes. No cranial nerve deficit. He exhibits normal muscle tone.  Skin: Skin is warm and dry. No rash noted. No erythema. No pallor.  Total body skin exam normal except for scar (from previous skin cancer removal  Psychiatric: He has a normal mood and affect. His behavior is normal. Judgment and thought content normal.          Assessment & Plan:  Healthy male  Gout continue allopurinol  Neuropathy continue Elavil  Hypertension continue current medications  Hyperlipidemia continue Lipitor and aspirin  Allergic rhinitis continue Zyrtec a steroid nasal spray  Chronic low back pain continue Flexeril 10 mg each bedtime  Osteoarthritis continue Relafen 500 twice a day  GERD continue Protonix 40 mg twice a day  Low potassium continue supplement  BPH with some symptoms status post TURP continue saw palmetto  COPD continue Spiriva and albuterol when necessary  Erectile dysfunction Levitra when necessary

## 2012-07-02 NOTE — Patient Instructions (Signed)
Continue your current medications  Followup in 6 months to recheck your prostate gland

## 2012-07-03 ENCOUNTER — Telehealth: Payer: Self-pay | Admitting: Family Medicine

## 2012-07-03 DIAGNOSIS — I10 Essential (primary) hypertension: Secondary | ICD-10-CM

## 2012-07-03 DIAGNOSIS — M1A9XX1 Chronic gout, unspecified, with tophus (tophi): Secondary | ICD-10-CM

## 2012-07-03 DIAGNOSIS — M1990 Unspecified osteoarthritis, unspecified site: Secondary | ICD-10-CM

## 2012-07-03 DIAGNOSIS — M545 Low back pain: Secondary | ICD-10-CM

## 2012-07-03 DIAGNOSIS — K219 Gastro-esophageal reflux disease without esophagitis: Secondary | ICD-10-CM

## 2012-07-03 DIAGNOSIS — J309 Allergic rhinitis, unspecified: Secondary | ICD-10-CM

## 2012-07-03 DIAGNOSIS — E785 Hyperlipidemia, unspecified: Secondary | ICD-10-CM

## 2012-07-03 DIAGNOSIS — J449 Chronic obstructive pulmonary disease, unspecified: Secondary | ICD-10-CM

## 2012-07-03 DIAGNOSIS — N400 Enlarged prostate without lower urinary tract symptoms: Secondary | ICD-10-CM

## 2012-07-03 DIAGNOSIS — J45909 Unspecified asthma, uncomplicated: Secondary | ICD-10-CM | POA: Insufficient documentation

## 2012-07-03 DIAGNOSIS — N529 Male erectile dysfunction, unspecified: Secondary | ICD-10-CM

## 2012-07-03 MED ORDER — AMLODIPINE BESYLATE 5 MG PO TABS: 5.0000 mg | ORAL_TABLET | Freq: Every day | ORAL | Status: DC

## 2012-07-03 MED ORDER — KETOCONAZOLE 200 MG PO TABS: 200.0000 mg | ORAL_TABLET | Freq: Every day | ORAL | Status: DC | PRN

## 2012-07-03 NOTE — Telephone Encounter (Signed)
Refills sent

## 2012-07-03 NOTE — Telephone Encounter (Signed)
Pt states Dr Tawanna Cooler escribed his scripts to pharm yesterday after cpx, but forgot 2 of them. They are: amLODipine (NORVASC) 5 MG tablet ketoconazole (NIZORAL) 200 MG tablet Pharm:  [facility], [place], [place]

## 2012-07-14 ENCOUNTER — Ambulatory Visit (INDEPENDENT_AMBULATORY_CARE_PROVIDER_SITE_OTHER): Payer: Medicare PPO | Admitting: Family Medicine

## 2012-07-14 ENCOUNTER — Encounter: Payer: Self-pay | Admitting: Family Medicine

## 2012-07-14 VITALS — BP 124/80 | HR 60 | Temp 98.4°F | Resp 18 | Wt 185.0 lb

## 2012-07-14 DIAGNOSIS — J019 Acute sinusitis, unspecified: Secondary | ICD-10-CM

## 2012-07-14 MED ORDER — AZITHROMYCIN 250 MG PO TABS: ORAL_TABLET | ORAL | Status: DC

## 2012-07-16 ENCOUNTER — Encounter: Payer: Self-pay | Admitting: Family Medicine

## 2012-07-16 ENCOUNTER — Telehealth: Payer: Self-pay | Admitting: Family Medicine

## 2012-07-16 NOTE — Telephone Encounter (Signed)
Patient Information:  Caller Name: Nihar  Phone: [phone]  Patient: [name], [name]  Gender: Male  DOB: [birth year]  Age: 72 Years  PCP: Gershon Crane [facility]  Office Follow Up:  Does the office need to follow up with this patient?: Yes  Instructions For The Office: Patient requesing medicaiton change /side effect  RN Note:  Reviewed Health Education on Zithromax. Patient is on B/p and diuretics and is concerned with side effects of the medication.  Patient usses- Pharmacy [facility] on [address] [phone].  PLEASE CONTACT AND UPDATE PATIENT.  Symptoms  Reason For Call & Symptoms: Patient is calling concerning about his Medication for Zpack that he was given on Saturday 07/14/12 for sinus infection.  He states after the first dose he became dizzy and his blood pressure became elevated. Loose stool last night.  Today,  At pulmonary Rehab today his  B/p 144/90 .  Now 142/77.   He wants to stop the zithromax and take something else.  He is scheduled to go on a cruise Friday and would like to start something else. HE CANNOT TAKE QUINOLONES THAT WILL AFFECT TENDONS.  He is currently  On B/P medications and Diuretic. He believes something was different once he took the Zpack  Reviewed Health History In EMR: Yes  Reviewed Medications In EMR: Yes  Reviewed Allergies In EMR: Yes  Reviewed Surgeries / Procedures: No  Date of Onset of Symptoms: 07/14/2012  Treatments Tried: Zithromax, humidier, saliene rinses.  Treatments Tried Worked: No  Guideline(s) Used:  Dizziness  Disposition Per Guideline:   Discuss with PCP and Callback by Nurse Today  Reason For Disposition Reached:   Taking a medicine that could cause dizziness (e.g., blood pressure medications, diuretics)  Advice Given:  Some Causes of Temporary Dizziness:  Poor Fluid Intake - Not drinking enough fluids and being a little dehydrated is a common cause of temporary dizziness. This is always worse  during hot weather.  Standing Up Suddenly - Standing up suddenly (especially getting out of bed) or prolonged standing in one place are common causes of temporary dizziness. Not drinking enough fluids always makes it worse. Certain medications can cause or increase this type of dizziness (e.g., blood pressure medications).  Drink Fluids:  Drink several glasses of fruit juice, other clear fluids, or water. This will improve hydration and blood glucose. If you have a fever or have had heat exposure, make sure the fluids are cold.  Rest for 1-2 Hours:  Lie down with feet elevated for 1 hour. This will improve blood flow and increase blood flow to the brain.  Call Back If:  Still feel dizzy after 2 hours of rest and fluids  Passes out (faints)  You become worse.  Stand Up Slowly:  In the mornings, sit up for a few minutes before you stand up. That will help your blood flow make the adjustment.  If you have to stand up for long periods of time, contract and relax your leg muscles to help pump the blood back to the heart.  Sit down or lie down if you feel dizzy.

## 2012-07-16 NOTE — Progress Notes (Signed)
  Subjective:    Patient ID: [name], male    DOB: 1941-02-17, 72 y.o.   MRN: [id number]  HPI Here for one week of sinus pressure, PND, HA, and ST. No cough or fever.    Review of Systems  Constitutional: Negative.   HENT: Positive for congestion, sore throat, postnasal drip and sinus pressure.   Eyes: Negative.   Respiratory: Negative.        Objective:   Physical Exam  Constitutional: He appears well-developed and well-nourished.  HENT:  Right Ear: External ear normal.  Left Ear: External ear normal.  Nose: Nose normal.  Mouth/Throat: Oropharynx is clear and moist.  Eyes: Conjunctivae are normal.  Neck: No thyromegaly present.  Pulmonary/Chest: Effort normal and breath sounds normal.  Lymphadenopathy:    He has no cervical adenopathy.          Assessment & Plan:  Add Mucinex and drink fluids

## 2012-07-17 MED ORDER — AMOXICILLIN 875 MG PO TABS: 875.0000 mg | ORAL_TABLET | Freq: Two times a day (BID) | ORAL | Status: DC

## 2012-07-17 NOTE — Telephone Encounter (Signed)
Stop the Zpack and switch to Amoxicillin 875 mg to take bid. Call in #20

## 2012-07-17 NOTE — Telephone Encounter (Signed)
Rx sent and patient is aware. 

## 2012-08-01 ENCOUNTER — Telehealth: Payer: Self-pay | Admitting: Emergency Medicine

## 2012-08-01 ENCOUNTER — Ambulatory Visit (INDEPENDENT_AMBULATORY_CARE_PROVIDER_SITE_OTHER): Payer: Medicare PPO | Admitting: Emergency Medicine

## 2012-08-01 ENCOUNTER — Encounter: Payer: Self-pay | Admitting: Emergency Medicine

## 2012-08-01 VITALS — BP 140/80 | HR 59 | Temp 98.3°F | Ht 69.0 in | Wt 181.0 lb

## 2012-08-01 DIAGNOSIS — J449 Chronic obstructive pulmonary disease, unspecified: Secondary | ICD-10-CM

## 2012-08-01 LAB — PULMONARY FUNCTION TEST

## 2012-08-01 MED ORDER — BUDESONIDE-FORMOTEROL FUMARATE 160-4.5 MCG/ACT IN AERO: 2.0000 | INHALATION_SPRAY | Freq: Two times a day (BID) | RESPIRATORY_TRACT | Status: DC

## 2012-08-01 NOTE — Progress Notes (Signed)
72 yo man with COPD, allergic rhinitis, GERD.   ROV 06/29/09 -- regular f/u for COPD. Has had some URI signs last several days. Having cough, scant sputum. Some nasal congestion. Not really manifesting itself as a change in his breathing, wheeze. Has been using mucinex. Overall breathing has been stable, he doesn't feels that he needs any addition to his maintanance regimen.   June 28, 2010 --Presents for an acute office visit.Complains of a dull ache in upper left chest wall x79months, described as "not acute, just there." He was seen by cardiology couple of weeks ago felt to be noncardiac pain. Also seen by PCP ?musculoskeletal pain. Says over last several weeks his breathing not as good as normal , has had to use rescue inhaler more often. NO calf pain or recent travel. Some wheezing intermittently.Denies orthopnea, hemoptysis, fever, n/v/d, edema, headache.  Describes as fleeting chest pain-dull at times. Non exertional. No pain on inspiration. Intermittently tender  Has increased reflux despite two times a day PPI. Has used Advil on /off but not on consitstent basis with some help.  ROV 05/30/12 -- hx COPD, allergies, GERD. Last seen by me in 2011. Returns to reestablish care. Remains on Spiriva qd, rarely uses SABA. He is having more difficulty exercising. He recently did a 6 minute walk at [place] and desaturated after 4 minutes. He wants to start pulm rehab. Denies mucous production, minimal wheeze, rare cough. He has flared once since I've seen him - recommended pred but he didn't take because poorly tolerated. Then treated with levaquin for bronchitis in 12/13.   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 Hypoxemia on exertion (newly identified). S/p spiro today, FEV1 stable compared with 02/2009. He is working with PT. He has desaturated and will qualify for O2 w exertion.  He is feeling well. Is willing to try LABA/ICS and to start O2.   PULMONARY FUNCTON TEST 03/11/2009 08/01/2012  FVC 2.03  2.58  FEV1 1.22 1.29  FEV1/FVC 60.1 50  FVC  % Predicted 45.3 61  FEV % Predicted 36.8 45  FeF 25-75  .38  FeF 25-75 % Predicted  2.5    Filed Vitals:   08/01/12 1355  BP: 140/80  Pulse: 59  Temp: 98.3 F (36.8 C)   Gen: Pleasant, well-nourished, in no distress,  normal affect  ENT: No lesions,  mouth clear,  oropharynx clear, no postnasal drip  Neck: No JVD, no TMG, no carotid bruits  Lungs: No use of accessory muscles, no dullness to percussion, clear without rales or rhonchi  Cardiovascular: RRR, heart sounds normal, no murmur or gallops, no peripheral edema  Musculoskeletal: No deformities, no cyanosis or clubbing  Neuro: alert, non focal  Skin: Warm, no lesions or rashes  COPD Spiro stable compared with 2010, severe AFL + hyperinflation. Clinically worse ? deconditioning +/- new hypoxemia.   Continue your Spiriva daily Start Symbicort 2 puffs twice a day. Please rinse out your mouth after you take it.  Keep track of your symptoms so we can review them on the new medication Use albuterol 2 puffs as needed We will start oxygen at 2L/min with exertion.  Follow with Dr Delton Coombes in 6 weeks or sooner if you have any problems

## 2012-08-01 NOTE — Telephone Encounter (Signed)
Patient expecting to receive o2 tank today for qualifying for o2 at [facility]. However the paperwork we received today from the patient didn't have the qualifying sats on it so we will need to get the correct paper from rehab that does. Patient thought o2 was going to be delivered to him today and until we can get qualifying sats it will not be delievered until then.  DME: [facility]

## 2012-08-01 NOTE — Patient Instructions (Addendum)
Continue your Spiriva daily Start Symbicort 2 puffs twice a day. Please rinse out your mouth after you take it.  Keep track of your symptoms so we can review them on the new medication Use albuterol 2 puffs as needed We will start oxygen at 2L/min with exertion.  Follow with Dr Delton Coombes in 6 weeks or sooner if you have any problems

## 2012-08-01 NOTE — Assessment & Plan Note (Signed)
Spiro stable compared with 2010, severe AFL + hyperinflation. Clinically worse ? deconditioning +/- new hypoxemia.   Continue your Spiriva daily Start Symbicort 2 puffs twice a day. Please rinse out your mouth after you take it.  Keep track of your symptoms so we can review them on the new medication Use albuterol 2 puffs as needed We will start oxygen at 2L/min with exertion.  Follow with Dr Delton Coombes in 6 weeks or sooner if you have any problems

## 2012-08-01 NOTE — Progress Notes (Signed)
PFT done today. 

## 2012-08-01 NOTE — Telephone Encounter (Signed)
Spoke with patient, he is aware that o2 will not be delivered today and that I will get in contact with [facility] re[facility] to gets patients qualifying sats. Patient verbalized understanding and I will get in contact with patient tomorrow.

## 2012-08-03 ENCOUNTER — Telehealth: Payer: Self-pay | Admitting: Emergency Medicine

## 2012-08-03 NOTE — Telephone Encounter (Signed)
Lauren please advise where we can schedule this pt to come in for a walk with his oxygen.  thanks

## 2012-08-03 NOTE — Telephone Encounter (Signed)
Gina from [facility] called.  States that she faxed the documents to [phone] & is re-faxing to [phone].  Asks to speak w/ Lauren & can be reached at [phone].  [name]

## 2012-08-03 NOTE — Telephone Encounter (Signed)
He needs to either do it again at rehab so we can have documentation, or come back to our office to walk.

## 2012-08-03 NOTE — Telephone Encounter (Signed)
I spoke with [name] and she stated she did receive order for pt O2. Since pulm rehab did not walk pt on the oxygen then his insurance may not pay for his oxygen since they go by medicare guidelines. Pt was in the office 08/01/12 but does not look like he was walked on oxygen at that time. She has left pt a message advising him of this as well Please advise RB thanks

## 2012-08-06 NOTE — Telephone Encounter (Signed)
Noted. Will forward to RB as FYI.  

## 2012-08-06 NOTE — Telephone Encounter (Signed)
[name] confirms that they have rec'd everything they need for pt to receive O2.  [name] states that at patient's request, they are going to get him set up w/ the O2 tomorrow morning due to pt's appointments today.  [name] can be reached at [phone].  [name]

## 2012-08-06 NOTE — Telephone Encounter (Signed)
ATC patient to have him come in for qualifying sats. Spoke with patients spouse patient currently headed to [employer] Per patient wife, patients has spoken with Christoper Allegra and they now have everything they need for patient to receive o2.   I have called [name] at [facility] back to verify this and she is currently in a meeting. LMOMTCB

## 2012-08-07 NOTE — Telephone Encounter (Signed)
Thank you :)

## 2012-08-13 ENCOUNTER — Telehealth: Payer: Self-pay | Admitting: Emergency Medicine

## 2012-08-13 DIAGNOSIS — J449 Chronic obstructive pulmonary disease, unspecified: Secondary | ICD-10-CM

## 2012-08-13 NOTE — Telephone Encounter (Signed)
LMTCB

## 2012-08-14 ENCOUNTER — Other Ambulatory Visit: Payer: Self-pay | Admitting: Physician Assistant

## 2012-08-14 NOTE — Telephone Encounter (Signed)
Pt returned call and can be reached @ [phone]. [name]

## 2012-08-14 NOTE — Telephone Encounter (Signed)
Pt wants a portable concentrator - please help change DME provider, thanks.

## 2012-08-14 NOTE — Telephone Encounter (Signed)
LMOMTCB x 1 

## 2012-08-14 NOTE — Telephone Encounter (Signed)
I spoke with pt. He wanting to get set up with portable oxygen concentrator. He currently has the smallest portable O2 from [facility]. Per pt [facility] does not carry portable O2 concentrators. Pt states he is going to [employer] in about a month when he finishes rehab. Also pt states he will be going to [place] Sept/Oct and feels the portable concentrator will be more convenient for him to carry around. He stated the cylinders is to much to lug around. He is willing to keep cylinders at home from [facility]. He is just wanting to stay as active as possible and feels the portable concentrator will help him do so. Please advise RB thanks

## 2012-08-15 NOTE — Telephone Encounter (Signed)
Order sent to [facility]. Carron Curie, CMA

## 2012-08-16 ENCOUNTER — Telehealth: Payer: Self-pay | Admitting: Emergency Medicine

## 2012-08-20 NOTE — Telephone Encounter (Signed)
[name] from [facility] called back to get the status of the CMN faxed on 3/27 for RB to fill out & sign.  States that she has not gotten an update.  [name] can be reached at [phone] ext [phone].  Please call ASAP w/ an update.

## 2012-08-20 NOTE — Telephone Encounter (Signed)
Form is in Dr. Delton Coombes folder to complete. Larita Fife is aware that form will be complete when Dr. Delton Coombes returns to office next week. Carron Curie, CMA

## 2012-08-25 ENCOUNTER — Other Ambulatory Visit: Payer: Self-pay | Admitting: Family Medicine

## 2012-08-26 ENCOUNTER — Encounter: Payer: Self-pay | Admitting: Family Medicine

## 2012-08-26 DIAGNOSIS — M545 Low back pain: Secondary | ICD-10-CM

## 2012-08-26 DIAGNOSIS — J449 Chronic obstructive pulmonary disease, unspecified: Secondary | ICD-10-CM

## 2012-08-26 DIAGNOSIS — N400 Enlarged prostate without lower urinary tract symptoms: Secondary | ICD-10-CM

## 2012-08-26 DIAGNOSIS — M1A9XX1 Chronic gout, unspecified, with tophus (tophi): Secondary | ICD-10-CM

## 2012-08-26 DIAGNOSIS — K219 Gastro-esophageal reflux disease without esophagitis: Secondary | ICD-10-CM

## 2012-08-26 DIAGNOSIS — N529 Male erectile dysfunction, unspecified: Secondary | ICD-10-CM

## 2012-08-26 DIAGNOSIS — F419 Anxiety disorder, unspecified: Secondary | ICD-10-CM

## 2012-08-26 DIAGNOSIS — E785 Hyperlipidemia, unspecified: Secondary | ICD-10-CM

## 2012-08-26 DIAGNOSIS — M1990 Unspecified osteoarthritis, unspecified site: Secondary | ICD-10-CM

## 2012-08-26 DIAGNOSIS — J309 Allergic rhinitis, unspecified: Secondary | ICD-10-CM

## 2012-08-26 DIAGNOSIS — I10 Essential (primary) hypertension: Secondary | ICD-10-CM

## 2012-08-27 ENCOUNTER — Encounter: Payer: Self-pay | Admitting: Emergency Medicine

## 2012-08-27 ENCOUNTER — Encounter: Payer: Self-pay | Admitting: *Deleted

## 2012-08-27 ENCOUNTER — Encounter: Payer: Self-pay | Admitting: Family Medicine

## 2012-08-27 ENCOUNTER — Other Ambulatory Visit: Payer: Self-pay | Admitting: *Deleted

## 2012-08-27 MED ORDER — AMITRIPTYLINE HCL 25 MG PO TABS: 25.0000 mg | ORAL_TABLET | Freq: Every day | ORAL | Status: DC

## 2012-09-12 ENCOUNTER — Ambulatory Visit (INDEPENDENT_AMBULATORY_CARE_PROVIDER_SITE_OTHER): Payer: Medicare PPO | Admitting: Emergency Medicine

## 2012-09-12 ENCOUNTER — Encounter: Payer: Self-pay | Admitting: Emergency Medicine

## 2012-09-12 VITALS — BP 110/70 | HR 61 | Temp 98.1°F | Ht 69.5 in | Wt 184.0 lb

## 2012-09-12 DIAGNOSIS — J449 Chronic obstructive pulmonary disease, unspecified: Secondary | ICD-10-CM

## 2012-09-12 NOTE — Assessment & Plan Note (Signed)
-   contineu spiriva + symbicort - o2 w exertion - saba prn - rov 6 month

## 2012-09-12 NOTE — Progress Notes (Signed)
72 yo man with COPD, allergic rhinitis, GERD.   ROV 06/29/09 -- regular f/u for COPD. Has had some URI signs last several days. Having cough, scant sputum. Some nasal congestion. Not really manifesting itself as a change in his breathing, wheeze. Has been using mucinex. Overall breathing has been stable, he doesn't feels that he needs any addition to his maintanance regimen.   June 28, 2010 --Presents for an acute office visit.Complains of a dull ache in upper left chest wall x75months, described as "not acute, just there." He was seen by cardiology couple of weeks ago felt to be noncardiac pain. Also seen by PCP ?musculoskeletal pain. Says over last several weeks his breathing not as good as normal , has had to use rescue inhaler more often. NO calf pain or recent travel. Some wheezing intermittently.Denies orthopnea, hemoptysis, fever, n/v/d, edema, headache.  Describes as fleeting chest pain-dull at times. Non exertional. No pain on inspiration. Intermittently tender  Has increased reflux despite two times a day PPI. Has used Advil on /off but not on consitstent basis with some help.  ROV 05/30/12 -- hx COPD, allergies, GERD. Last seen by me in 2011. Returns to reestablish care. Remains on Spiriva qd, rarely uses SABA. He is having more difficulty exercising. He recently did a 6 minute walk at [place] and desaturated after 4 minutes. He wants to start pulm rehab. Denies mucous production, minimal wheeze, rare cough. He has flared once since I've seen him - recommended pred but he didn't take because poorly tolerated. Then treated with levaquin for bronchitis in 12/13.   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 Hypoxemia on exertion (newly identified). S/p spiro today, FEV1 stable compared with 02/2009. He is working with PT. He has desaturated and will qualify for O2 w exertion.  He is feeling well. Is willing to try LABA/ICS and to start O2.   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 Hypoxemia on exertion. Last time we started O2 w exertion, also added Symbicort to spiriva. Has been compliant w O2. Tolerating the BD's. His SABA use has decreased with addition of symbicort. He finished pulm rehab and is going to start silver sneakers.   Filed Vitals:   09/12/12 1423  BP: 110/70  Pulse: 61  Temp: 98.1 F (36.7 C)   Gen: Pleasant, well-nourished, in no distress,  normal affect  ENT: No lesions,  mouth clear,  oropharynx clear, no postnasal drip  Neck: No JVD, no TMG, no carotid bruits  Lungs: No use of accessory muscles, clear without rales or rhonchi  Cardiovascular: RRR, heart sounds normal, no murmur or gallops, no peripheral edema  Musculoskeletal: No deformities, no cyanosis or clubbing  Neuro: alert, non focal  Skin: Warm, no lesions or rashes  COPD - contineu spiriva + symbicort - o2 w exertion - saba prn - rov 6 month

## 2012-09-12 NOTE — Patient Instructions (Addendum)
Please continue to use your oxygen with exertion Continue your exercise routine Continue your spiriva and Symbicort Use albuterol as needed  Follow with Dr Delton Coombes in 6 months or sooner if you have any problems

## 2012-09-21 ENCOUNTER — Encounter: Payer: Self-pay | Admitting: Family Medicine

## 2012-09-24 ENCOUNTER — Encounter: Payer: Self-pay | Admitting: Family Medicine

## 2012-10-03 ENCOUNTER — Other Ambulatory Visit: Payer: Self-pay | Admitting: Family Medicine

## 2012-12-13 ENCOUNTER — Telehealth: Payer: Self-pay | Admitting: Family Medicine

## 2012-12-13 DIAGNOSIS — R972 Elevated prostate specific antigen [PSA]: Secondary | ICD-10-CM

## 2012-12-13 NOTE — Telephone Encounter (Signed)
Labs ordered and appointment rescheduled

## 2012-12-13 NOTE — Telephone Encounter (Signed)
Pt was sch for appt with dr todd on 12-31-12. Pt would like to have psa recheck due to elevation level. Can I sch appt for PSA and rsc appt with MD

## 2012-12-16 ENCOUNTER — Encounter: Payer: Self-pay | Admitting: Family Medicine

## 2012-12-18 ENCOUNTER — Other Ambulatory Visit: Payer: Self-pay | Admitting: Family Medicine

## 2012-12-18 DIAGNOSIS — N400 Enlarged prostate without lower urinary tract symptoms: Secondary | ICD-10-CM

## 2012-12-31 ENCOUNTER — Other Ambulatory Visit (INDEPENDENT_AMBULATORY_CARE_PROVIDER_SITE_OTHER): Payer: Medicare PPO

## 2012-12-31 ENCOUNTER — Ambulatory Visit: Payer: Medicare PPO | Admitting: Family Medicine

## 2012-12-31 ENCOUNTER — Encounter: Payer: Self-pay | Admitting: Family Medicine

## 2012-12-31 DIAGNOSIS — R972 Elevated prostate specific antigen [PSA]: Secondary | ICD-10-CM

## 2012-12-31 LAB — PSA: PSA: 1.04 ng/mL (ref 0.10–4.00)

## 2013-01-01 ENCOUNTER — Other Ambulatory Visit: Payer: Self-pay | Admitting: Physician Assistant

## 2013-01-01 DIAGNOSIS — D229 Melanocytic nevi, unspecified: Secondary | ICD-10-CM

## 2013-01-01 HISTORY — DX: Melanocytic nevi, unspecified: D22.9

## 2013-02-11 ENCOUNTER — Encounter: Payer: Self-pay | Admitting: Gastroenterology

## 2013-02-12 ENCOUNTER — Ambulatory Visit: Payer: Medicare PPO | Admitting: Family Medicine

## 2013-02-18 ENCOUNTER — Ambulatory Visit (INDEPENDENT_AMBULATORY_CARE_PROVIDER_SITE_OTHER): Payer: Medicare PPO | Admitting: Family Medicine

## 2013-02-18 ENCOUNTER — Encounter: Payer: Self-pay | Admitting: Family Medicine

## 2013-02-18 VITALS — BP 130/90 | Temp 97.4°F | Wt 178.0 lb

## 2013-02-18 DIAGNOSIS — N400 Enlarged prostate without lower urinary tract symptoms: Secondary | ICD-10-CM

## 2013-02-18 NOTE — Patient Instructions (Signed)
Continue current therapy followup in February for your annual evaluation sooner if any problems

## 2013-02-18 NOTE — Progress Notes (Signed)
  Subjective:    Patient ID: [name], male    DOB: December 27, 1940, 72 y.o.   MRN: [id number]  HPI Ed is a 72 year old married male nonsmoker comes in today for evaluation of 3 issues  The main issue is the elevation of his PSA. It went from 0.6-2 and then followup dropped to 12 months ago. 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 He's due to go see Dr. Perry Mount for followup. However Valentina Gu his wife changed his diet he soft fat and sodas and the loose bowel movements have pretty much resolved.  His underlying COPD and is working out at the gym 6 days a week. He does develop severe hypoxia. His pulse ox would drop to 82 with exercise he uses supplemental oxygen.   Review of Systems Review of systems otherwise negative    Objective:   Physical Exam  Well-developed well-nourished male in no acute distress      Assessment & Plan:  Elevated PSA without further increase actually it's decrease therefore no further evaluation at this juncture  COPD continue exercise program and supplemental oxygen followup by pulmonary  Loose bowel movements more consistent with a lactose deficiency symptoms markedly improved with change in diet followup by Dr. Christella Hartigan

## 2013-03-13 ENCOUNTER — Encounter: Payer: Self-pay | Admitting: Emergency Medicine

## 2013-03-13 ENCOUNTER — Ambulatory Visit (INDEPENDENT_AMBULATORY_CARE_PROVIDER_SITE_OTHER): Payer: Medicare PPO | Admitting: Emergency Medicine

## 2013-03-13 VITALS — BP 130/82 | HR 66 | Ht 70.0 in | Wt 176.6 lb

## 2013-03-13 DIAGNOSIS — J449 Chronic obstructive pulmonary disease, unspecified: Secondary | ICD-10-CM

## 2013-03-13 DIAGNOSIS — J4489 Other specified chronic obstructive pulmonary disease: Secondary | ICD-10-CM

## 2013-03-13 DIAGNOSIS — J309 Allergic rhinitis, unspecified: Secondary | ICD-10-CM

## 2013-03-13 NOTE — Assessment & Plan Note (Addendum)
Doing very well. Active and tolerating BD's. Rare SABA use.  - continue same

## 2013-03-13 NOTE — Assessment & Plan Note (Signed)
-   zyrtec, flonase, chlorpheniramine

## 2013-03-13 NOTE — Patient Instructions (Signed)
Continue your same medications CONGRATULATIONS on your excellent exercise routine! Keep it up! Follow with Dr Delton Coombes in 6 months or sooner if you have any problems

## 2013-03-13 NOTE — Progress Notes (Signed)
72 yo man with COPD, allergic rhinitis, GERD.   ROV 06/29/09 -- regular f/u for COPD. Has had some URI signs last several days. Having cough, scant sputum. Some nasal congestion. Not really manifesting itself as a change in his breathing, wheeze. Has been using mucinex. Overall breathing has been stable, he doesn't feels that he needs any addition to his maintanance regimen.   June 28, 2010 --Presents for an acute office visit.Complains of a dull ache in upper left chest wall x79months, described as "not acute, just there." He was seen by cardiology couple of weeks ago felt to be noncardiac pain. Also seen by PCP ?musculoskeletal pain. Says over last several weeks his breathing not as good as normal , has had to use rescue inhaler more often. NO calf pain or recent travel. Some wheezing intermittently.Denies orthopnea, hemoptysis, fever, n/v/d, edema, headache.  Describes as fleeting chest pain-dull at times. Non exertional. No pain on inspiration. Intermittently tender  Has increased reflux despite two times a day PPI. Has used Advil on /off but not on consitstent basis with some help.  ROV 05/30/12 -- hx COPD, allergies, GERD. Last seen by me in 2011. Returns to reestablish care. Remains on Spiriva qd, rarely uses SABA. He is having more difficulty exercising. He recently did a 6 minute walk at [place] and desaturated after 4 minutes. He wants to start pulm rehab. Denies mucous production, minimal wheeze, rare cough. He has flared once since I've seen him - recommended pred but he didn't take because poorly tolerated. Then treated with levaquin for bronchitis in 12/13.   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 Hypoxemia on exertion (newly identified). S/p spiro today, FEV1 stable compared with 02/2009. He is working with PT. He has desaturated and will qualify for O2 w exertion.  He is feeling well. Is willing to try LABA/ICS and to start O2.   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 Hypoxemia on exertion. Last time we started O2 w exertion, also added Symbicort to spiriva. Has been compliant w O2. Tolerating the BD's. His SABA use has decreased with addition of symbicort. He finished pulm rehab and is going to start silver sneakers.   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 Hypoxemia on exertion. Maintained on spiriva + symbicort. He has been active, doing cardio at [facility], strength conditioning at the gym. He is doing well, states that breathing is stable. His endurance is better. Using O2 with heavy exertion. Flu shot up to day.    Filed Vitals:   03/13/13 1454  BP: 130/82  Pulse: 66  Height: 5\' 10"  (1.778 m)  Weight: 176 lb 9.6 oz (80.105 kg)  SpO2: 99%    Gen: Pleasant, well-nourished, in no distress,  normal affect  ENT: No lesions,  mouth clear,  oropharynx clear, no postnasal drip  Neck: No JVD, no TMG, no carotid bruits  Lungs: No use of accessory muscles, clear without rales or rhonchi  Cardiovascular: RRR, heart sounds normal, no murmur or gallops, no peripheral edema  Musculoskeletal: No deformities, no cyanosis or clubbing  Neuro: alert, non focal  Skin: Warm, no lesions or rashes  COPD Doing very well. Active and tolerating BD's. Rare SABA use.  -  continue same  ALLERGIC RHINITIS - zyrtec, flonase, chlorpheniramine

## 2013-03-15 ENCOUNTER — Ambulatory Visit (INDEPENDENT_AMBULATORY_CARE_PROVIDER_SITE_OTHER): Payer: Medicare PPO | Admitting: Gastroenterology

## 2013-03-15 ENCOUNTER — Encounter: Payer: Self-pay | Admitting: Gastroenterology

## 2013-03-15 VITALS — BP 130/80 | HR 63 | Ht 69.0 in | Wt 178.0 lb

## 2013-03-15 DIAGNOSIS — R141 Gas pain: Secondary | ICD-10-CM

## 2013-03-15 DIAGNOSIS — R140 Abdominal distension (gaseous): Secondary | ICD-10-CM

## 2013-03-15 NOTE — Patient Instructions (Addendum)
Stop taking inulin (fiber choice).  This is a non-digestible carbohydrate that is well known to cause bloating and gas when it reaches the colon and is broken down by colonic bacteria. It is only in the colon that bacteria metabolise inulin, with the release of significant quantities of carbon dioxide, hydrogen, and/or methane. You will have labs checked at [facility].  Please head down after you check out with the front desk  (celiac panel). 03/18/13 1045 am [facility] If stopping the inulin is not helpful, then cut back on dietary lettuce, spinach.  Please return to see Dr. Christella Hartigan in 6 weeks, sooner if needed.

## 2013-03-15 NOTE — Progress Notes (Signed)
Review of pertinent gastrointestinal problems: 1. Personal history of tubular adenomas. Colonoscopy 3/07 with small TAs, next recall 07/2010.  09/2010 colonoscopy, Christella Hartigan, TICs, diminutive polyp that was not adenomatous.  Recommended recall at 5 years. 2. Schatzki's ring, dilated to 20 mm with a balloon during EGD August, 2009. Very difficult IV access patient needed PICC line placed prior to this procedure  3. GERD: Rather severe symptoms, spring 2010 symptoms controlled on twice daily PPI as well as bedtime H2 blocker.  4. Gastritis: reactive, noted on EGD above, biopsies showed no H. Pylori  HPI: This is a    very pleasant 72 year old man who is here with his wife today.  Whom i last saw 4 years ago in the office.  Has been very gassy and bloated for at least 6 months.  Also was having diarrhea, loose stool once daily. No bleeding.    Though dairy was causing it, stopped the dairy did not help.  Probiotics have not helped.  "Shacklee" a pro biotic for past 3 weeks has helped.  Taking imodium and pepto.  Sick of taking it.  Very limited diet and that helps.  When eats junk food, then feels worse  Has lost 5-6 pounds, intentionally.   No recent travel.  No abx in at least a year.  Maybe worse after breads.   Review of systems: Pertinent positive and negative review of systems were noted in the above HPI section. Complete review of systems was performed and was otherwise normal.   Past Medical History  Diagnosis Date  . GERD (gastroesophageal reflux disease)   . Hyperlipidemia   . Hypertension   . Allergic rhinitis   . Benign prostatic hypertrophy   . COPD (chronic obstructive pulmonary disease)   . Osteoarthritis     DJD  . ED (erectile dysfunction)   . BPH (benign prostatic hyperplasia)   . 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 Double collecting system renal    . Right inguinal  hernia repair    . Tonsillectomy    . Transurethral resection of prostate      Current Outpatient Prescriptions  Medication Sig Dispense Refill  . albuterol (PROAIR HFA) 108 (90 BASE) MCG/ACT inhaler Inhale 1 puff into the lungs every 4 (four) hours as needed for wheezing.  2 Inhaler  2  . allopurinol (ZYLOPRIM) 300 MG tablet Take 0.5 tablets (150 mg total) by mouth daily.  100 tablet  3  . amitriptyline (ELAVIL) 25 MG tablet Take 1 tablet (25 mg total) by mouth at bedtime.  100 tablet  3  . amLODipine (NORVASC) 5 MG tablet Take 1 tablet (5 mg total) by mouth daily.  100 tablet  3  . aspirin 81 MG tablet Take 81 mg by mouth daily.      Marland Kitchen atenolol (TENORMIN) 50 MG tablet Take 1 tablet (50 mg total) by mouth daily.  100 tablet  3  . atorvastatin (LIPITOR) 40 MG tablet Take 1 tablet (40 mg total) by mouth every evening.  100 tablet  3  . budesonide-formoterol (SYMBICORT) 160-4.5 MCG/ACT inhaler Inhale 2 puffs into the lungs 2 (two) times daily.  1 Inhaler  11  . cetirizine (ZYRTEC) 10 MG tablet Take 10 mg by mouth daily.        . cyclobenzaprine (FLEXERIL) 10 MG tablet Take 1 tablet (10 mg total) by mouth at bedtime.  90 tablet  3  . fluticasone (FLONASE) 50 MCG/ACT nasal spray Place 2 sprays into the nose daily. Each nostril.  48 g  11  . Inulin (FIBER CHOICE PO) Take by mouth. 3gram per day      . ketoconazole (NIZORAL) 200 MG tablet Take 1 tablet (200 mg total) by mouth daily as needed.  100 tablet  3  . KLOR-CON M20 20 MEQ tablet take 1 tablet by mouth once daily  100 tablet  3  . losartan (COZAAR) 25 MG tablet Take 1 tablet (25 mg total) by mouth daily.  100 tablet  3  . nabumetone (RELAFEN) 500 MG tablet take 1 tablet by mouth twice a day  200 tablet  4  . pantoprazole (PROTONIX) 40 MG tablet Take 1 tablet (40 mg total) by mouth 2 (two) times daily.  200 tablet  3  . SAW PALMETTO, SERENOA REPENS, PO Take 1 capsule by mouth daily.        Marland Kitchen tiotropium (SPIRIVA) 18 MCG inhalation capsule  Place 1 capsule (18 mcg total) into inhaler and inhale daily.  100 capsule  3  . triamterene-hydrochlorothiazide (MAXZIDE) 75-50 MG per tablet take 1 tablet by mouth once daily AFTER BREAKFAST  90 tablet  3  . vardenafil (LEVITRA) 20 MG tablet Take 1 tablet (20 mg total) by mouth daily as needed for erectile dysfunction.  6 tablet  11   No current facility-administered medications for this visit.    Allergies as of 03/15/2013 - Review Complete 03/15/2013  Allergen Reaction Noted  . Codeine  12/08/2006  . Zithromax [azithromycin]  07/17/2012    Family History  Problem Relation Age of Onset  . Gallbladder disease Father   . Colon polyps Father   . Heart attack Mother   . Arthritis Mother   . Hypertension Mother   . Arthritis Sister     History   Social History  . Marital Status: Married    Spouse Name: N/A    Number of Children: N/A  . Years of Education: N/A   Occupational History  . Ph.D. Retired     Midwife lab at [employer]   Social History Main Topics  . Smoking status: Former Smoker    Types: Cigars    Quit date: 05/16/2005  . Smokeless tobacco: Never Used     Comment: 1 small cigar/day x 2 years  . Alcohol Use: No  . Drug Use: No  . Sexual Activity: Not on file   Other Topics Concern  . Not on file   Social History Narrative   Regular Exercise.        Physical Exam: BP 130/80  Pulse 63  Ht 5\' 9"  (1.753 m)  Wt 178 lb (80.74 kg)  BMI 26.27 kg/m2  SpO2 97% Constitutional: generally well-appearing Psychiatric: alert and oriented x3 Eyes: extraocular movements intact Mouth: oral pharynx moist, no lesions Neck: supple no lymphadenopathy Cardiovascular: heart regular rate and rhythm Lungs: clear to auscultation bilaterally Abdomen: soft, nontender, nondistended, no obvious ascites, no peritoneal signs, normal bowel sounds Extremities: no lower extremity edema bilaterally Skin: no lesions on visible  extremities    Assessment and plan: 72 y.o. male with  gas, bloating, improving diarrhea  Perhaps you celiac sprue. He is going to get a celiac panel drawn today or early next week. He is on fiber supplement 3 times a day and he sounds like he and his wife have sounds at least 2-3 times a week. I suspect  his gas and bloating are dietary related. I asked him first to stop taking the fiber supplement. He is going to give this 3 weeks trial and if he is not improved then they are going to cut back on some of the less, Spanish the date. He'll return to see me in 6 weeks and sooner if needed. Overall I do not think he has any concerning like neoplasm going on. Colonoscopy just 2 years ago, see those results summarized above.

## 2013-03-18 ENCOUNTER — Other Ambulatory Visit (INDEPENDENT_AMBULATORY_CARE_PROVIDER_SITE_OTHER): Payer: Medicare PPO

## 2013-03-18 DIAGNOSIS — N400 Enlarged prostate without lower urinary tract symptoms: Secondary | ICD-10-CM

## 2013-03-18 DIAGNOSIS — R140 Abdominal distension (gaseous): Secondary | ICD-10-CM

## 2013-03-18 LAB — PSA: PSA: 2.24 ng/mL (ref 0.10–4.00)

## 2013-03-20 LAB — CELIAC PANEL 10
Gliadin IgG: 3.9 U/mL (ref <20)
IgA: 290 mg/dL (ref 68–379)
Tissue Transglutaminase Ab, IgA: 7.7 U/mL (ref <20)

## 2013-03-23 ENCOUNTER — Encounter: Payer: Self-pay | Admitting: Gastroenterology

## 2013-03-27 ENCOUNTER — Other Ambulatory Visit: Payer: Self-pay

## 2013-04-02 ENCOUNTER — Other Ambulatory Visit: Payer: Self-pay | Admitting: Physician Assistant

## 2013-04-26 ENCOUNTER — Ambulatory Visit: Payer: Medicare PPO | Admitting: Gastroenterology

## 2013-05-31 ENCOUNTER — Ambulatory Visit: Payer: Medicare PPO | Admitting: Gastroenterology

## 2013-06-25 ENCOUNTER — Other Ambulatory Visit (INDEPENDENT_AMBULATORY_CARE_PROVIDER_SITE_OTHER): Payer: Medicare PPO

## 2013-06-25 ENCOUNTER — Encounter: Payer: Self-pay | Admitting: Family Medicine

## 2013-06-25 DIAGNOSIS — I10 Essential (primary) hypertension: Secondary | ICD-10-CM

## 2013-06-25 DIAGNOSIS — N400 Enlarged prostate without lower urinary tract symptoms: Secondary | ICD-10-CM

## 2013-06-25 DIAGNOSIS — Z0000 Encounter for general adult medical examination without abnormal findings: Secondary | ICD-10-CM

## 2013-06-25 DIAGNOSIS — E785 Hyperlipidemia, unspecified: Secondary | ICD-10-CM

## 2013-06-25 LAB — CBC WITH DIFFERENTIAL/PLATELET
BASOS PCT: 0.5 % (ref 0.0–3.0)
Basophils Absolute: 0 10*3/uL (ref 0.0–0.1)
EOS PCT: 1.8 % (ref 0.0–5.0)
Eosinophils Absolute: 0.1 10*3/uL (ref 0.0–0.7)
HCT: 51.2 % (ref 39.0–52.0)
HEMOGLOBIN: 16.7 g/dL (ref 13.0–17.0)
LYMPHS PCT: 26.9 % (ref 12.0–46.0)
Lymphs Abs: 2.1 10*3/uL (ref 0.7–4.0)
MCHC: 32.6 g/dL (ref 30.0–36.0)
MCV: 92.8 fl (ref 78.0–100.0)
MONO ABS: 0.5 10*3/uL (ref 0.1–1.0)
Monocytes Relative: 6.7 % (ref 3.0–12.0)
NEUTROS ABS: 5.1 10*3/uL (ref 1.4–7.7)
NEUTROS PCT: 64.1 % (ref 43.0–77.0)
Platelets: 217 10*3/uL (ref 150.0–400.0)
RBC: 5.52 Mil/uL (ref 4.22–5.81)
RDW: 13.3 % (ref 11.5–14.6)
WBC: 7.9 10*3/uL (ref 4.5–10.5)

## 2013-06-25 LAB — POCT URINALYSIS DIPSTICK
Bilirubin, UA: NEGATIVE
Glucose, UA: NEGATIVE
KETONES UA: NEGATIVE
Leukocytes, UA: NEGATIVE
Nitrite, UA: NEGATIVE
PH UA: 6
Protein, UA: NEGATIVE
RBC UA: NEGATIVE
Spec Grav, UA: 1.015
Urobilinogen, UA: 0.2

## 2013-06-25 LAB — LIPID PANEL
CHOL/HDL RATIO: 4
Cholesterol: 181 mg/dL (ref 0–200)
HDL: 44.3 mg/dL (ref >39.00)
LDL CALC: 107 mg/dL — AB (ref 0–99)
Triglycerides: 151 mg/dL — ABNORMAL HIGH (ref 0.0–149.0)
VLDL: 30.2 mg/dL (ref 0.0–40.0)

## 2013-06-25 LAB — BASIC METABOLIC PANEL
BUN: 24 mg/dL — ABNORMAL HIGH (ref 6–23)
CHLORIDE: 103 meq/L (ref 96–112)
CO2: 27 mEq/L (ref 19–32)
CREATININE: 1.2 mg/dL (ref 0.4–1.5)
Calcium: 9.1 mg/dL (ref 8.4–10.5)
GFR: 62.12 mL/min (ref >60.00)
Glucose, Bld: 94 mg/dL (ref 70–99)
POTASSIUM: 4.3 meq/L (ref 3.5–5.1)
SODIUM: 139 meq/L (ref 135–145)

## 2013-06-25 LAB — HEPATIC FUNCTION PANEL
ALK PHOS: 81 U/L (ref 39–117)
ALT: 16 U/L (ref 0–53)
AST: 33 U/L (ref 0–37)
Albumin: 3.7 g/dL (ref 3.5–5.2)
BILIRUBIN DIRECT: 0.1 mg/dL (ref 0.0–0.3)
TOTAL PROTEIN: 6.4 g/dL (ref 6.0–8.3)
Total Bilirubin: 0.8 mg/dL (ref 0.3–1.2)

## 2013-06-25 LAB — PSA: PSA: 0.58 ng/mL (ref 0.10–4.00)

## 2013-06-25 LAB — TSH: TSH: 2.24 u[IU]/mL (ref 0.35–5.50)

## 2013-06-30 ENCOUNTER — Encounter: Payer: Self-pay | Admitting: Family Medicine

## 2013-06-30 ENCOUNTER — Other Ambulatory Visit: Payer: Self-pay | Admitting: Family Medicine

## 2013-07-01 ENCOUNTER — Encounter: Payer: Self-pay | Admitting: Family Medicine

## 2013-07-02 ENCOUNTER — Encounter: Payer: Medicare PPO | Admitting: Family Medicine

## 2013-07-18 ENCOUNTER — Encounter: Payer: Self-pay | Admitting: Emergency Medicine

## 2013-07-18 ENCOUNTER — Encounter: Payer: Self-pay | Admitting: Family Medicine

## 2013-07-18 ENCOUNTER — Ambulatory Visit (INDEPENDENT_AMBULATORY_CARE_PROVIDER_SITE_OTHER): Payer: Medicare PPO | Admitting: Family Medicine

## 2013-07-18 VITALS — BP 130/80 | Temp 98.2°F | Ht 69.0 in | Wt 182.0 lb

## 2013-07-18 DIAGNOSIS — M545 Low back pain, unspecified: Secondary | ICD-10-CM

## 2013-07-18 DIAGNOSIS — M66329 Spontaneous rupture of flexor tendons, unspecified upper arm: Secondary | ICD-10-CM

## 2013-07-18 DIAGNOSIS — J449 Chronic obstructive pulmonary disease, unspecified: Secondary | ICD-10-CM

## 2013-07-18 DIAGNOSIS — K219 Gastro-esophageal reflux disease without esophagitis: Secondary | ICD-10-CM

## 2013-07-18 DIAGNOSIS — J45909 Unspecified asthma, uncomplicated: Secondary | ICD-10-CM

## 2013-07-18 DIAGNOSIS — F411 Generalized anxiety disorder: Secondary | ICD-10-CM

## 2013-07-18 DIAGNOSIS — I10 Essential (primary) hypertension: Secondary | ICD-10-CM

## 2013-07-18 DIAGNOSIS — M1A9XX1 Chronic gout, unspecified, with tophus (tophi): Secondary | ICD-10-CM

## 2013-07-18 DIAGNOSIS — N400 Enlarged prostate without lower urinary tract symptoms: Secondary | ICD-10-CM

## 2013-07-18 DIAGNOSIS — J309 Allergic rhinitis, unspecified: Secondary | ICD-10-CM

## 2013-07-18 DIAGNOSIS — N529 Male erectile dysfunction, unspecified: Secondary | ICD-10-CM

## 2013-07-18 DIAGNOSIS — Z23 Encounter for immunization: Secondary | ICD-10-CM

## 2013-07-18 DIAGNOSIS — F419 Anxiety disorder, unspecified: Secondary | ICD-10-CM

## 2013-07-18 DIAGNOSIS — M1990 Unspecified osteoarthritis, unspecified site: Secondary | ICD-10-CM

## 2013-07-18 DIAGNOSIS — E785 Hyperlipidemia, unspecified: Secondary | ICD-10-CM

## 2013-07-18 MED ORDER — AMOXICILLIN 875 MG PO TABS: 875.0000 mg | ORAL_TABLET | Freq: Two times a day (BID) | ORAL | Status: DC

## 2013-07-18 MED ORDER — PANTOPRAZOLE SODIUM 40 MG PO TBEC: 40.0000 mg | DELAYED_RELEASE_TABLET | Freq: Two times a day (BID) | ORAL | Status: DC

## 2013-07-18 MED ORDER — KETOCONAZOLE 200 MG PO TABS: 200.0000 mg | ORAL_TABLET | Freq: Every day | ORAL | Status: AC | PRN

## 2013-07-18 MED ORDER — ALLOPURINOL 300 MG PO TABS: 150.0000 mg | ORAL_TABLET | Freq: Every day | ORAL | Status: DC

## 2013-07-18 MED ORDER — AMITRIPTYLINE HCL 25 MG PO TABS: 25.0000 mg | ORAL_TABLET | Freq: Every day | ORAL | Status: DC

## 2013-07-18 MED ORDER — NABUMETONE 500 MG PO TABS: ORAL_TABLET | ORAL | Status: DC

## 2013-07-18 MED ORDER — LOSARTAN POTASSIUM 25 MG PO TABS: 25.0000 mg | ORAL_TABLET | Freq: Every day | ORAL | Status: DC

## 2013-07-18 MED ORDER — TRIAMTERENE-HCTZ 75-50 MG PO TABS: ORAL_TABLET | ORAL | Status: DC

## 2013-07-18 MED ORDER — TIOTROPIUM BROMIDE MONOHYDRATE 18 MCG IN CAPS: ORAL_CAPSULE | RESPIRATORY_TRACT | Status: DC

## 2013-07-18 MED ORDER — BACLOFEN 10 MG PO TABS: 10.0000 mg | ORAL_TABLET | Freq: Three times a day (TID) | ORAL | Status: DC

## 2013-07-18 MED ORDER — ALBUTEROL SULFATE HFA 108 (90 BASE) MCG/ACT IN AERS: 1.0000 | INHALATION_SPRAY | RESPIRATORY_TRACT | Status: DC | PRN

## 2013-07-18 MED ORDER — ATENOLOL 50 MG PO TABS: 50.0000 mg | ORAL_TABLET | Freq: Every day | ORAL | Status: DC

## 2013-07-18 MED ORDER — VARDENAFIL HCL 20 MG PO TABS: 20.0000 mg | ORAL_TABLET | Freq: Every day | ORAL | Status: DC | PRN

## 2013-07-18 MED ORDER — BUDESONIDE-FORMOTEROL FUMARATE 160-4.5 MCG/ACT IN AERO: 2.0000 | INHALATION_SPRAY | Freq: Two times a day (BID) | RESPIRATORY_TRACT | Status: DC

## 2013-07-18 MED ORDER — ATORVASTATIN CALCIUM 40 MG PO TABS: 40.0000 mg | ORAL_TABLET | Freq: Every evening | ORAL | Status: DC

## 2013-07-18 MED ORDER — FLUTICASONE PROPIONATE 50 MCG/ACT NA SUSP: 2.0000 | Freq: Every day | NASAL | Status: DC

## 2013-07-18 MED ORDER — AMLODIPINE BESYLATE 5 MG PO TABS: 5.0000 mg | ORAL_TABLET | Freq: Every day | ORAL | Status: DC

## 2013-07-18 MED ORDER — POTASSIUM CHLORIDE CRYS ER 20 MEQ PO TBCR: EXTENDED_RELEASE_TABLET | ORAL | Status: DC

## 2013-07-18 NOTE — Patient Instructions (Addendum)
Continue your current medications  Followup in 1 year sooner if any problems  Discontinue the Flexeril  Will substitute the baclofen 10 mg at bedtime when necessary  The soak and file your toenails weekly

## 2013-07-18 NOTE — Telephone Encounter (Signed)
Dr. Byrum, please advise. Thanks.  

## 2013-07-18 NOTE — Progress Notes (Signed)
Subjective:    Patient ID: [name], male    DOB: [birth year], 73 y.o.   MRN: [id number]  HPI [name] is a 73 year old married male nonsmoker who comes in today for a Medicare wellness examination because of history of gout, sleep dysfunction, hypertension, hyperlipidemia, COPD, chronic back pain, allergic rhinitis, osteoarthritis, reflux esophagitis, and erectile dysfunction  His med list reviewed has been no changes except insurance will no longer pay for Flexeril was switched to baclofen 10 mg daily  He gets routine eye care, dental care, colonoscopy and GI, vaccinations up-to-date by Apolonio Schneiders  Cognitive function normal he walks on a regular basis home health safety reviewed no issues identified, no guns in the house, he does have a health care power of attorney and living well  He's been degrees per orthopedics because of sensation of pins and his feet. They think is related hammertoes. They recommended corrective surgery   Review of Systems  Constitutional: Negative.   HENT: Negative.   Eyes: Negative.   Respiratory: Negative.   Cardiovascular: Negative.   Gastrointestinal: Negative.   Endocrine: Negative.   Genitourinary: Negative.   Musculoskeletal: Negative.   Skin: Negative.   Allergic/Immunologic: Negative.   Neurological: Negative.   Hematological: Negative.   Psychiatric/Behavioral: Negative.        Objective:   Physical Exam  Nursing note and vitals reviewed. Constitutional: He is oriented to person, place, and time. He appears well-developed and well-nourished.  HENT:  Head: Normocephalic and atraumatic.  Right Ear: External ear normal.  Left Ear: External ear normal.  Nose: Nose normal.  Mouth/Throat: Oropharynx is clear and moist.  Eyes: Conjunctivae and EOM are normal. Pupils are equal, round, and reactive to light.  Neck: Normal range of motion. Neck supple. No JVD present. No tracheal deviation present. No thyromegaly present.  Cardiovascular: Normal  rate, regular rhythm, normal heart sounds and intact distal pulses.  Exam reveals no gallop and no friction rub.   No murmur heard. No carotid or accrues peripheral pulses 2+ and symmetrical  Pulmonary/Chest: Effort normal. No stridor. No respiratory distress. He has no wheezes. He has no rales. He exhibits no tenderness.  Symmetrical decrease in breath sounds from underlying COPD no wheezing  Abdominal: Soft. Bowel sounds are normal. He exhibits no distension and no mass. There is no tenderness. There is no rebound and no guarding.  Genitourinary: Rectum normal and penis normal. Guaiac negative stool. No penile tenderness.  2+ symmetrical BPH nonnodular  Musculoskeletal: Normal range of motion. He exhibits no edema and no tenderness.  Lymphadenopathy:    He has no cervical adenopathy.  Neurological: He is alert and oriented to person, place, and time. He has normal reflexes. No cranial nerve deficit. He exhibits normal muscle tone.  A deformity left hand from previous drug abuse  Skin: Skin is warm and dry. No rash noted. No erythema. No pallor.  Psychiatric: He has a normal mood and affect. His behavior is normal. Judgment and thought content normal.          Assessment & Plan:  Hypertension at goal continue current therapy  Underlying COPD continue current therapy  History of gout continue current therapy  Sleep dysfunction continue Elavil  Hyperlipidemia continue Lipitor 40 mg  Chronic back pain continue baclofen 10 mg each bedtime  Allergic rhinitis continue OTC Zyrtec  Osteoarthritis negative one tablet twice daily  Reflux esophagitis continue protonic 40 mg daily  Peripheral edema continue Maxzide 1 daily  Erectile dysfunction continue Levitra 20  mg when necessary

## 2013-07-18 NOTE — Progress Notes (Signed)
Pre visit review using our clinic review tool, if applicable. No additional management support is needed unless otherwise documented below in the visit note. 

## 2013-07-19 ENCOUNTER — Telehealth: Payer: Self-pay | Admitting: Family Medicine

## 2013-07-19 NOTE — Telephone Encounter (Signed)
Relevant patient education assigned to patient using Emmi. ° °

## 2013-08-14 ENCOUNTER — Encounter: Payer: Self-pay | Admitting: Family Medicine

## 2013-08-14 DIAGNOSIS — M545 Low back pain, unspecified: Secondary | ICD-10-CM

## 2013-08-14 MED ORDER — BACLOFEN 10 MG PO TABS: 10.0000 mg | ORAL_TABLET | Freq: Three times a day (TID) | ORAL | Status: DC

## 2013-08-22 ENCOUNTER — Ambulatory Visit (INDEPENDENT_AMBULATORY_CARE_PROVIDER_SITE_OTHER): Payer: Medicare PPO | Admitting: Emergency Medicine

## 2013-08-22 ENCOUNTER — Encounter: Payer: Self-pay | Admitting: Emergency Medicine

## 2013-08-22 VITALS — BP 126/74 | HR 59 | Temp 97.9°F | Ht 69.0 in | Wt 177.6 lb

## 2013-08-22 DIAGNOSIS — J449 Chronic obstructive pulmonary disease, unspecified: Secondary | ICD-10-CM

## 2013-08-22 NOTE — Assessment & Plan Note (Signed)
Continue your Symbicort and Spiriva Use albuterol as needed Please continue your fluticasone and zyrtec.  We will renew your handicap placard Follow with Dr Lamonte Sakai in 6 months or sooner if you have any problems

## 2013-08-22 NOTE — Progress Notes (Signed)
73 yo man with COPD, allergic rhinitis, GERD.   ROV 06/29/09 -- regular f/u for COPD. Has had some URI signs last several days. Having cough, scant sputum. Some nasal congestion. Not really manifesting itself as a change in his breathing, wheeze. Has been using mucinex. Overall breathing has been stable, he doesn't feels that he needs any addition to his maintanance regimen.   June 28, 2010 --Presents for an acute office visit.Complains of a dull ache in upper left chest wall x34months, described as "not acute, just there." He was seen by cardiology couple of weeks ago felt to be noncardiac pain. Also seen by PCP ?musculoskeletal pain. Says over last several weeks his breathing not as good as normal , has had to use rescue inhaler more often. NO calf pain or recent travel. Some wheezing intermittently.Denies orthopnea, hemoptysis, fever, n/v/d, edema, headache.  Describes as fleeting chest pain-dull at times. Non exertional. No pain on inspiration. Intermittently tender  Has increased reflux despite two times a day PPI. Has used Advil on /off but not on consitstent basis with some help.  ROV 05/30/12 -- hx COPD, allergies, GERD. Last seen by me in 2011. Returns to reestablish care. Remains on Spiriva qd, rarely uses SABA. He is having more difficulty exercising. He recently did a 6 minute walk at [place] and desaturated after 4 minutes. He wants to start pulm rehab. Denies mucous production, minimal wheeze, rare cough. He has flared once since I've seen him - recommended pred but he didn't take because poorly tolerated. Then treated with levaquin for bronchitis in 12/13.   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 Hypoxemia on exertion (newly identified). S/p spiro today, FEV1 stable compared with 02/2009. He is working with PT. He has desaturated and will qualify for O2 w exertion.  He is feeling well. Is willing to try LABA/ICS and to start O2.   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 Hypoxemia on exertion. Last time we started O2 w exertion, also added Symbicort to spiriva. Has been compliant w O2. Tolerating the BD's. His SABA use has decreased with addition of symbicort. He finished pulm rehab and is going to start silver sneakers.   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 Hypoxemia on exertion. Maintained on spiriva + symbicort. He has been active, [facility], strength conditioning at the gym. He is doing well, states that breathing is stable. His endurance is better. Using O2 with heavy exertion. Flu shot up to day.   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 Hypoxemia on exertion. He has been wearing his O2 with exertion - trimming hedges. He is on Symbicort + Spiriva.  He is on zyrtec and fluticasone. He stays active. His breathing may not be quite as good as last year this time. No flares. No hospitalizations. Uses albuterol    Filed Vitals:   08/22/13 1330  BP: 126/74  Pulse: 59  Temp: 97.9 F (36.6 C)  TempSrc: Oral  Height: 5\' 9"  (1.753 m)  Weight: 177 lb 9.6 oz (80.559 kg)  SpO2: 97%    Gen: Pleasant, well-nourished, in no distress,  normal affect  ENT: No lesions,  mouth clear,  oropharynx clear, no  postnasal drip  Neck: No JVD, no TMG, no carotid bruits  Lungs: No use of accessory muscles, clear without rales or rhonchi  Cardiovascular: RRR, heart sounds normal, no murmur or gallops, no peripheral edema  Musculoskeletal: No deformities, no cyanosis or clubbing  Neuro: alert, non focal  Skin: Warm, no lesions or rashes  COPD Continue your Symbicort and Spiriva Use albuterol as needed Please continue your fluticasone and zyrtec.  We will renew your handicap placard Follow with Dr Lamonte Sakai in 6 months or sooner if you have any  problems

## 2013-08-22 NOTE — Patient Instructions (Signed)
Continue your Symbicort and Spiriva Use albuterol as needed Please continue your fluticasone and zyrtec.  We will renew your handicap placard Follow with Dr Katalaya Beel in 6 months or sooner if you have any problems 

## 2013-08-27 ENCOUNTER — Encounter: Payer: Self-pay | Admitting: Family Medicine

## 2013-09-17 ENCOUNTER — Other Ambulatory Visit: Payer: Self-pay | Admitting: Physician Assistant

## 2013-09-30 ENCOUNTER — Encounter: Payer: Self-pay | Admitting: Family Medicine

## 2013-09-30 NOTE — Telephone Encounter (Signed)
I've called and submitted the PA request. Ref# [id number]

## 2013-10-04 ENCOUNTER — Encounter: Payer: Self-pay | Admitting: Family Medicine

## 2013-10-04 MED ORDER — CYCLOBENZAPRINE HCL 10 MG PO TABS: 10.0000 mg | ORAL_TABLET | Freq: Every day | ORAL | Status: DC

## 2013-10-04 NOTE — Telephone Encounter (Signed)
PA was APPROVED

## 2013-10-04 NOTE — Telephone Encounter (Signed)
rx sent

## 2013-10-25 ENCOUNTER — Encounter: Payer: Self-pay | Admitting: Physician Assistant

## 2013-10-25 ENCOUNTER — Ambulatory Visit (INDEPENDENT_AMBULATORY_CARE_PROVIDER_SITE_OTHER): Payer: Medicare PPO | Admitting: Physician Assistant

## 2013-10-25 VITALS — BP 128/70 | HR 63 | Temp 98.3°F | Resp 18 | Wt 177.0 lb

## 2013-10-25 DIAGNOSIS — T675XXA Heat exhaustion, unspecified, initial encounter: Secondary | ICD-10-CM

## 2013-10-25 LAB — POCT URINALYSIS DIPSTICK
BILIRUBIN UA: NEGATIVE
Blood, UA: NEGATIVE
Glucose, UA: NEGATIVE
KETONES UA: NEGATIVE
Leukocytes, UA: NEGATIVE
Nitrite, UA: NEGATIVE
SPEC GRAV UA: 1.02
Urobilinogen, UA: 0.2
pH, UA: 6.5

## 2013-10-25 NOTE — Progress Notes (Signed)
Subjective:    Patient ID: [name], male    DOB: May 24, 1940, 73 y.o.   MRN: [id number]  HPI Yesterday was at the [facility], was out in the heat without O2 (COPD with O2), then once returned home, mowed outside for a few hours. Got back into the house and was extremely thirsty, temperature was over 100F. After that, muscles became sore, and developed lack of appetite and fatigue, then had hard time falling asleep, was very cold and very short of breath. Normally doesn't need oxygen to sleep, however needed it last night to sleep. Other symptoms were headache, nausea (after taking ibuprofen for HA on empty stomach). Pt denies vomiting, diarrhea, chest pain, syncope.  Pt is also concerned bc he had a tick bite several weeks ago, and was worried that these symptoms were potentially related. He has had no rash, or local skin infection from the tick. He has not had any other symptoms.  Review of Systems As per HPI are otherwise negative.    Past Medical History  Diagnosis Date  . GERD (gastroesophageal reflux disease)   . Hyperlipidemia   . Hypertension   . Allergic rhinitis   . Benign prostatic hypertrophy   . COPD (chronic obstructive pulmonary disease)   . Osteoarthritis     DJD  . ED (erectile dysfunction)   . BPH (benign prostatic hyperplasia)   . 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 Double collecting system renal    . Right inguinal hernia repair    . Tonsillectomy    . Transurethral resection of prostate      reports that he quit smoking about 8 years ago. His smoking use included Cigars. He has never used smokeless tobacco. He reports that he does not drink alcohol or use illicit drugs. family history includes Arthritis in his mother and sister; Colon polyps in his father; Gallbladder disease in his father; Heart attack in his mother; Hypertension in his mother. Allergies  Allergen  Reactions  . Codeine     REACTION: RASH  . Zithromax [Azithromycin]     sensitivity       Objective:   Physical Exam  Nursing note and vitals reviewed. Constitutional: He is oriented to person, place, and time. He appears well-developed and well-nourished. No distress.  HENT:  Head: Normocephalic and atraumatic.  Eyes: Conjunctivae and EOM are normal. Pupils are equal, round, and reactive to light.  Neck: Normal range of motion. Neck supple.  Cardiovascular: Normal rate, regular rhythm, normal heart sounds and intact distal pulses.  Exam reveals no gallop and no friction rub.   No murmur heard. Pulmonary/Chest: Effort normal and breath sounds normal. No respiratory distress. He has no wheezes. He has no rales. He exhibits no tenderness.  Musculoskeletal: Normal range of motion.  Neurological: He is alert and oriented to person, place, and time.  Skin: Skin is warm and dry. No rash noted. He is not diaphoretic. No erythema. No pallor.  Small healed insect bite on the right abdomen. No erythema, no visible foreign body, no ttp, no excessive warmth, no fluctuance.  Psychiatric: He has a normal mood and affect. His behavior is normal. Judgment and thought content normal.    Filed Vitals:   10/25/13 1303  BP: 128/70  Pulse: 63  Temp: 98.3 F (36.8 C)  TempSrc: Oral  Resp: 18  Weight: 177 lb (80.287 kg)  SpO2:  97%     Lab Results  Component Value Date   WBC 7.9 06/25/2013   HGB 16.7 06/25/2013   HCT 51.2 06/25/2013   PLT 217.0 06/25/2013   GLUCOSE 94 06/25/2013   CHOL 181 06/25/2013   TRIG 151.0* 06/25/2013   HDL 44.30 06/25/2013   LDLDIRECT 95.2 06/25/2012   LDLCALC 107* 06/25/2013   ALT 16 06/25/2013   AST 33 06/25/2013   NA 139 06/25/2013   K 4.3 06/25/2013   CL 103 06/25/2013   CREATININE 1.2 06/25/2013   BUN 24* 06/25/2013   CO2 27 06/25/2013   TSH 2.24 06/25/2013   PSA 0.58 06/25/2013         Assessment & Plan:  [name] was seen today for nausea.  Diagnoses and  associated orders for this visit:  Heat exhaustion Comments: Non syncopal. Resolved. Will check labs. - POC Urinalysis Dipstick  Pt will continue to monitor symptoms for any signs of worsening, and continue to monitor for any constitutional symptoms or rash in the area of the tick bite.  Return precautions provided, and pt handout on heat-related illness.  Plan to follow up with PCP as needed, or for worsening or persistent symptoms.   Patient Instructions  Continue to monitor your symptoms. If you have symptoms redevelop, or worsen, seek medical attention.  Avoid staying out of the sun without protective gear or clothing, as you can develop the symptoms very quickly.  Maintain plenty of fluid hydration.  Followup as needed, or if symptoms worsen or fail to improve despite treatment.

## 2013-10-25 NOTE — Patient Instructions (Signed)
Continue to monitor your symptoms. If you have symptoms redevelop, or worsen, seek medical attention.  Avoid staying out of the sun without protective gear or clothing, as you can develop the symptoms very quickly.  Maintain plenty of fluid hydration.  Followup as needed, or if symptoms worsen or fail to improve despite treatment.     Heat-Related Illness Heat-related illnesses occur when the body is unable to properly cool itself. The body normally cools itself by sweating. However, under some conditions sweating is not enough. In these cases, a person's body temperature rises rapidly. Very high body temperatures may damage the brain or other vital organs. Some examples of heat-related illnesses include:  Heat stroke. This occurs when the body is unable to regulate its temperature. The body's temperature rises rapidly, the sweating mechanism fails, and the body is unable to cool down. Body temperature may rise to 106 F (41 C) or higher within 10 to 15 minutes. Heat stroke can cause death or permanent disability if emergency treatment is not provided.  Heat exhaustion. This is a milder form of heat-related illness that can develop after several days of exposure to high temperatures and not enough fluids. It is the body's response to an excessive loss of the water and salt contained in sweat.  Heat cramps. These usually affect people who sweat a lot during heavy activity. This sweating drains the body's salt and moisture. The low salt level in the muscles causes painful cramps. Heat cramps may also be a symptom of heat exhaustion. Heat cramps usually occur in the abdomen, arms, or legs. Get medical attention for cramps if you have heart problems or are on a low-sodium diet. Those that are at greatest risk for heat-related illnesses include:   The elderly.  Infant and the very young.  People with mental illness and chronic diseases.  People who are overweight (obese).  Young and healthy  people can even succumb to heat if they participate in strenuous physical activities during hot weather. CAUSES  Several factors affect the body's ability to cool itself during extremely hot weather. When the humidity is high, sweat will not evaporate as quickly. This prevents the body from releasing heat quickly. Other factors that can affect the body's ability to cool down include:   Age.  Obesity.  Fever.  Dehydration.  Heart disease.  Mental illness.  Poor circulation.  Sunburn.  Prescription drug use.  Alcohol use. SYMPTOMS  Heat stroke: Warning signs of heat stroke vary, but may include:  An extremely high body temperature (above 103F orally).  A fast, strong pulse.  Dizziness.  Confusion.  Red, hot, and dry skin.  No sweating.  Throbbing headache.  Feeling sick to your stomach (nauseous).  Unconsciousness. Heat exhaustion: Warning signs of heat exhaustion include:  Heavy sweating.  Tiredness.  Headache.  Paleness.  Weakness.  Feeling sick to your stomach (nauseous) or vomiting.  Muscle cramps. Heat cramps  Muscle pains or spasms. TREATMENT  Heat stroke  Get into a cool environment. An indoor place that is air-conditioned may be best.  Take a cool shower or bath. Have someone around to make sure you are okay.  Take your temperature. Make sure it is going down. Heat exhaustion  Drink plenty of fluids. Do not drink liquids that contain caffeine, alcohol, or large amounts of sugar. These cause you to lose more body fluid. Also, avoid very cold drinks. They can cause stomach cramps.  Get into a cool environment. An indoor place that is air-conditioned  may be best.  Take a cool shower or bath. Have someone around to make sure you are okay.  Put on lightweight clothing. Heat cramps  Stop whatever activity you were doing. Do not attempt to do that activity for at least 3 hours after the cramps have gone away.  Get into a cool  environment. An indoor place that is air-conditioned may be best. Elim  To protect your health when temperatures are extremely high, follow these tips:  During heavy exercise in a hot environment, drink two to four glasses (16-32 ounces) of cool fluids each hour. Do not wait until you are thirsty to drink. Warning: If your caregiver limits the amount of fluid you drink or has you on water pills, ask how much you should drink while the weather is hot.  Do not drink liquids that contain caffeine, alcohol, or large amounts of sugar. These cause you to lose more body fluid.  Avoid very cold drinks. They can cause stomach cramps.  Wear appropriate clothing. Choose lightweight, light-colored, loose-fitting clothing.  If you must be outdoors, try to limit your outdoor activity to morning and evening hours. Try to rest often in shady areas.  If you are not used to working or exercising in a hot environment, start slowly and pick up the pace gradually.  Stay cool in an air-conditioned place if possible. If your home does not have air conditioning, go to the shopping mall or [employer].  Taking a cool shower or bath may help you cool off. SEEK MEDICAL CARE IF:   You see any of the symptoms listed above. You may be dealing with a life-threatening emergency.  Symptoms worsen or last longer than 1 hour.  Heat cramps do not get better in 1 hour. MAKE SURE YOU:   Understand these instructions.  Will watch your condition.  Will get help right away if you are not doing well or get worse. Document Released: 02/09/2008 Document Revised: 07/25/2011 Document Reviewed: 02/09/2008 [facility] Patient Information 2014 [place].

## 2013-10-25 NOTE — Progress Notes (Signed)
Pre visit review using our clinic review tool, if applicable. No additional management support is needed unless otherwise documented below in the visit note. 

## 2013-10-28 ENCOUNTER — Ambulatory Visit (INDEPENDENT_AMBULATORY_CARE_PROVIDER_SITE_OTHER): Payer: Medicare PPO | Admitting: Family Medicine

## 2013-10-28 ENCOUNTER — Encounter: Payer: Self-pay | Admitting: Family Medicine

## 2013-10-28 VITALS — BP 110/80 | Temp 98.2°F | Wt 176.0 lb

## 2013-10-28 DIAGNOSIS — L02419 Cutaneous abscess of limb, unspecified: Secondary | ICD-10-CM | POA: Insufficient documentation

## 2013-10-28 DIAGNOSIS — L03119 Cellulitis of unspecified part of limb: Principal | ICD-10-CM | POA: Insufficient documentation

## 2013-10-28 MED ORDER — CEPHALEXIN 500 MG PO CAPS: 500.0000 mg | ORAL_CAPSULE | Freq: Four times a day (QID) | ORAL | Status: DC

## 2013-10-28 NOTE — Patient Instructions (Signed)
Keflex 500 mg,,,,,,,,,, 2 twice daily  Return when necessary

## 2013-10-28 NOTE — Progress Notes (Signed)
   Subjective:    Patient ID: [name], male    DOB: 1940-06-03, 73 y.o.   MRN: [id number]  HPI It is a 73 year old male married nonsmoker who comes in today for evaluation of cellulitis in his right lower extremity  [place] and extending in his right lower anterior tibia. He developed some soft tissue swelling and redness start amoxicillin this past Friday. It seems to improve but not better   Review of Systems    review of systems otherwise negative tetanus booster up-to-date Objective:   Physical Exam Well-developed well-nourished male in no acute distress vital signs stable he is afebrile examination lower extremities shows a quarter size area of erythema consistent with a very superficial cellulitis       Assessment & Plan:  Cellulitis right lower extremity,,,,,,,,,,,,,,

## 2013-10-28 NOTE — Progress Notes (Signed)
Pre visit review using our clinic review tool, if applicable. No additional management support is needed unless otherwise documented below in the visit note. 

## 2014-01-13 ENCOUNTER — Telehealth: Payer: Self-pay | Admitting: *Deleted

## 2014-01-13 ENCOUNTER — Ambulatory Visit (INDEPENDENT_AMBULATORY_CARE_PROVIDER_SITE_OTHER): Payer: Medicare PPO | Admitting: Emergency Medicine

## 2014-01-13 ENCOUNTER — Encounter: Payer: Self-pay | Admitting: Emergency Medicine

## 2014-01-13 VITALS — BP 112/80 | HR 62 | Ht 69.0 in | Wt 178.0 lb

## 2014-01-13 DIAGNOSIS — J449 Chronic obstructive pulmonary disease, unspecified: Secondary | ICD-10-CM

## 2014-01-13 MED ORDER — PREDNISONE 20 MG PO TABS: 30.0000 mg | ORAL_TABLET | Freq: Every day | ORAL | Status: DC

## 2014-01-13 NOTE — Telephone Encounter (Signed)
Pt seen today by Dr Lamonte Sakai Advised to take Chlorpheniramine 4mg  at bedtime  Pt had two concerns:  (1) Will the Chlorpheniramine interact with his Amitriptyline? Is it safe to take together?  (pt advised to speak with pharmacist regarding this as well)  (2) How long will he be on Chlorpheniramine?  Until 3 month follow-up?  Please advise Dr Lamonte Sakai. Thanks.

## 2014-01-13 NOTE — Patient Instructions (Signed)
Please increase your fluticasone nasal spray to 2 sprays each nostril twice a day Continue zyrtec daily Use chlorpheniramine 4mg  (OTC) each evening about an hour before bedtime.  Continue to use your nasal saline. Consider restarting your full nasal saline rinses once a day Take prednisone 30mg  daily for 5 days and then stop.  Decrease your Afrin to 1 spray each side each evening x 2 days, then start prednisone as above and stop the Afrin altogether.  Get the Flu shot this Fall.  Follow with Dr Lamonte Sakai in 3 months or sooner if you have any problems.

## 2014-01-13 NOTE — Progress Notes (Signed)
73 yo man with COPD, allergic rhinitis, GERD.   ROV 06/29/09 -- regular f/u for COPD. Has had some URI signs last several days. Having cough, scant sputum. Some nasal congestion. Not really manifesting itself as a change in his breathing, wheeze. Has been using mucinex. Overall breathing has been stable, he doesn't feels that he needs any addition to his maintanance regimen.   June 28, 2010 --Presents for an acute office visit.Complains of a dull ache in upper left chest wall x60months, described as "not acute, just there." He was seen by cardiology couple of weeks ago felt to be noncardiac pain. Also seen by PCP ?musculoskeletal pain. Says over last several weeks his breathing not as good as normal , has had to use rescue inhaler more often. NO calf pain or recent travel. Some wheezing intermittently.Denies orthopnea, hemoptysis, fever, n/v/d, edema, headache.  Describes as fleeting chest pain-dull at times. Non exertional. No pain on inspiration. Intermittently tender  Has increased reflux despite two times a day PPI. Has used Advil on /off but not on consitstent basis with some help.  ROV 05/30/12 -- hx COPD, allergies, GERD. Last seen by me in 2011. Returns to reestablish care. Remains on Spiriva qd, rarely uses SABA. He is having more difficulty exercising. He recently did a 6 minute walk at [place] and desaturated after 4 minutes. He wants to start pulm rehab. Denies mucous production, minimal wheeze, rare cough. He has flared once since I've seen him - recommended pred but he didn't take because poorly tolerated. Then treated with levaquin for bronchitis in 12/13.   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 Hypoxemia on exertion (newly identified). S/p spiro today, FEV1 stable compared with 02/2009. He is working with PT. He has desaturated and will qualify for O2 w exertion.  He is feeling well. Is willing to try LABA/ICS and to start O2.   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 Hypoxemia on exertion. Last time we started O2 w exertion, also added Symbicort to spiriva. Has been compliant w O2. Tolerating the BD's. His SABA use has decreased with addition of symbicort. He finished pulm rehab and is going to start silver sneakers.   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 Hypoxemia on exertion. Maintained on spiriva + symbicort. He has been active, doing cardio at [facility], strength conditioning at the gym. He is doing well, states that breathing is stable. His endurance is better. Using O2 with heavy exertion. Flu shot up to day.   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 Hypoxemia on exertion. He has been wearing his O2 with exertion - trimming hedges. He is on Symbicort + Spiriva.  He is on zyrtec and fluticasone. He stays active. His breathing may not be quite as good as last year this time. No flares. No hospitalizations. Uses albuterol.   ROV 01/13/14 -- follow up visit for Stage b-c COPD, hypoxemia. He believes that he has done fairly well, but with some decline this Spring > increased cough, non-productive. Hoarseness and nasal sinus congestion. Having ear fullness and ringing. He has taken his zyrtec, flonase. He takes afrin and saline every night. 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    Filed Vitals:   01/13/14 1455  BP: 112/80  Pulse: 62  Height: 5\' 9"  (1.753 m)  Weight: 178 lb (80.74 kg)  SpO2: 95%    Gen: Pleasant, well-nourished, in no distress,  normal affect  ENT: No lesions,  mouth clear,  oropharynx clear, no postnasal drip  Neck: No JVD, no TMG, no carotid bruits  Lungs: No use of accessory muscles, clear without rales or rhonchi  Cardiovascular: RRR, heart sounds normal, no murmur or gallops, no peripheral  edema  Musculoskeletal: No deformities, no cyanosis or clubbing  Neuro: alert, non focal  Skin: Warm, no lesions or rashes  ALLERGIC RHINITIS Has been worse this Spring and now continues. Exacerbated by his Afrin use. Will try to ramp up treatment, get him off the Afrin. If unsuccessful then will refer him to ENT for eval.  Please increase your fluticasone nasal spray to 2 sprays each nostril twice a day Continue zyrtec daily Use chlorpheniramine 4mg  (OTC) each evening about an hour before bedtime.  Continue to use your nasal saline. Consider restarting your full nasal saline rinses once a day Take prednisone 30mg  daily for 5 days and then stop.  Decrease your Afrin to 1 spray each side each evening x 2 days, then start prednisone as above and stop the Afrin altogether.  Get the Flu shot this Fall.  Follow with Dr Lamonte Sakai in 3 months or sooner if you have any problems.  COPD Continue same inhaled regimen.

## 2014-01-13 NOTE — Assessment & Plan Note (Signed)
Continue same inhaled regimen.

## 2014-01-13 NOTE — Assessment & Plan Note (Signed)
Has been worse this Spring and now continues. Exacerbated by his Afrin use. Will try to ramp up treatment, get him off the Afrin. If unsuccessful then will refer him to ENT for eval.  Please increase your fluticasone nasal spray to 2 sprays each nostril twice a day Continue zyrtec daily Use chlorpheniramine 4mg  (OTC) each evening about an hour before bedtime.  Continue to use your nasal saline. Consider restarting your full nasal saline rinses once a day Take prednisone 30mg  daily for 5 days and then stop.  Decrease your Afrin to 1 spray each side each evening x 2 days, then start prednisone as above and stop the Afrin altogether.  Get the Flu shot this Fall.  Follow with Dr Lamonte Sakai in 3 months or sooner if you have any problems.

## 2014-01-14 NOTE — Telephone Encounter (Signed)
Pt returned call- [phone]

## 2014-01-14 NOTE — Telephone Encounter (Signed)
I don't know of any such interaction with these two medications. If it happens again then please have him stop the chlorpheniramine

## 2014-01-14 NOTE — Telephone Encounter (Signed)
Called spoke with pt. He reports he did take these medications at bedtime. He woke up at 2 am with suicidal thoughts and feeling wired.  Please advise RB thanks

## 2014-01-14 NOTE — Telephone Encounter (Signed)
The only caution to give him is that the combo of amitry[ptline and chlorpheniramine can make him sleepy, that's why he will do best to take it at night. I think he should stay on it until we follow up

## 2014-01-14 NOTE — Telephone Encounter (Signed)
Called made pt aware of recs. Nothing further needed 

## 2014-01-14 NOTE — Telephone Encounter (Signed)
ATC pt's home #, [phone], rang numerous time  NA and no option to leave msg.  [email or web address] on pt's cell #, [phone]

## 2014-01-14 NOTE — Telephone Encounter (Signed)
Pt would like to know something today about this medication.

## 2014-01-22 ENCOUNTER — Other Ambulatory Visit: Payer: Self-pay | Admitting: Physician Assistant

## 2014-02-10 ENCOUNTER — Encounter: Payer: Self-pay | Admitting: Family Medicine

## 2014-03-06 ENCOUNTER — Other Ambulatory Visit: Payer: Self-pay | Admitting: Physician Assistant

## 2014-03-24 ENCOUNTER — Encounter: Payer: Self-pay | Admitting: Family Medicine

## 2014-03-25 ENCOUNTER — Encounter: Payer: Self-pay | Admitting: Family Medicine

## 2014-04-08 ENCOUNTER — Ambulatory Visit (INDEPENDENT_AMBULATORY_CARE_PROVIDER_SITE_OTHER): Payer: Medicare PPO | Admitting: Emergency Medicine

## 2014-04-08 ENCOUNTER — Encounter: Payer: Self-pay | Admitting: Emergency Medicine

## 2014-04-08 VITALS — BP 128/80 | HR 93 | Ht 70.0 in | Wt 179.6 lb

## 2014-04-08 DIAGNOSIS — J449 Chronic obstructive pulmonary disease, unspecified: Secondary | ICD-10-CM

## 2014-04-08 NOTE — Progress Notes (Signed)
73 yo man with COPD, allergic rhinitis, GERD.   ROV 06/29/09 -- regular f/u for COPD. Has had some URI signs last several days. Having cough, scant sputum. Some nasal congestion. Not really manifesting itself as a change in his breathing, wheeze. Has been using mucinex. Overall breathing has been stable, he doesn't feels that he needs any addition to his maintanance regimen.   June 28, 2010 --Presents for an acute office visit.Complains of a dull ache in upper left chest wall x60months, described as "not acute, just there." He was seen by cardiology couple of weeks ago felt to be noncardiac pain. Also seen by PCP ?musculoskeletal pain. Says over last several weeks his breathing not as good as normal , has had to use rescue inhaler more often. NO calf pain or recent travel. Some wheezing intermittently.Denies orthopnea, hemoptysis, fever, n/v/d, edema, headache.  Describes as fleeting chest pain-dull at times. Non exertional. No pain on inspiration. Intermittently tender  Has increased reflux despite two times a day PPI. Has used Advil on /off but not on consitstent basis with some help.  ROV 05/30/12 -- hx COPD, allergies, GERD. Last seen by me in 2011. Returns to reestablish care. Remains on Spiriva qd, rarely uses SABA. He is having more difficulty exercising. He recently did a 6 minute walk at [place] and desaturated after 4 minutes. He wants to start pulm rehab. Denies mucous production, minimal wheeze, rare cough. He has flared once since I've seen him - recommended pred but he didn't take because poorly tolerated. Then treated with levaquin for bronchitis in 12/13.   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 Hypoxemia on exertion (newly identified). S/p spiro today, FEV1 stable compared with 02/2009. He is working with PT. He has desaturated and will qualify for O2 w exertion.  He is feeling well. Is willing to try LABA/ICS and to start O2.   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 Hypoxemia on exertion. Last time we started O2 w exertion, also added Symbicort to spiriva. Has been compliant w O2. Tolerating the BD's. His SABA use has decreased with addition of symbicort. He finished pulm rehab and is going to start silver sneakers.   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 Hypoxemia on exertion. Maintained on spiriva + symbicort. He has been active, doing cardio at [facility], strength conditioning at the gym. He is doing well, states that breathing is stable. His endurance is better. Using O2 with heavy exertion. Flu shot up to day.   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 Hypoxemia on exertion. He has been wearing his O2 with exertion - trimming hedges. He is on Symbicort + Spiriva.  He is on zyrtec and fluticasone. He stays active. His breathing may not be quite as good as last year this time. No flares. No hospitalizations. Uses albuterol.   ROV 01/13/14 -- follow up visit for Stage b-c COPD, hypoxemia. He believes that he has done fairly well, but with some decline this Spring > increased cough, non-productive. Hoarseness and nasal sinus congestion. Having ear fullness and ringing. He has taken his zyrtec, flonase. He takes afrin and saline every night. 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   ROV 04/08/14 -- follow up visit for his COPD, sinus congestion. Last time we treated with prednisone for his congestion and Meniere's. He has been taking flonase bid, nsw. He is on symbicort. Very rare albuterol. He uses O2 with heavy exertion. He is active, does cardio 4-5x a week. He has some scratchy throat.    Filed Vitals:   04/08/14 1539  BP: 128/80  Pulse: 93  Height: 5\' 10"  (1.778 m)  Weight: 179 lb 9.6 oz (81.466 kg)  SpO2: 95%    Gen:  Pleasant, well-nourished, in no distress,  normal affect  ENT: No lesions,  mouth clear,  oropharynx clear, no postnasal drip  Neck: No JVD, no TMG, no carotid bruits  Lungs: No use of accessory muscles, clear without rales or rhonchi  Cardiovascular: RRR, heart sounds normal, no murmur or gallops, no peripheral edema  Musculoskeletal: No deformities, no cyanosis or clubbing  Neuro: alert, non focal  Skin: Warm, no lesions or rashes  COPD (chronic obstructive pulmonary disease) Has been doing well - still has some trouble with mucous and rhinitis.   Please continue your medications as you have been taking them Use your oxygen with heavy exertion.  Follow with Dr Lamonte Sakai in 6 months or sooner if you have any problems

## 2014-04-08 NOTE — Patient Instructions (Signed)
Please continue your medications as you have been taking them Use your oxygen with heavy exertion.  Follow with Dr Lamonte Sakai in 6 months or sooner if you have any problems

## 2014-04-08 NOTE — Assessment & Plan Note (Signed)
Has been doing well - still has some trouble with mucous and rhinitis.   Please continue your medications as you have been taking them Use your oxygen with heavy exertion.  Follow with Dr Lamonte Sakai in 6 months or sooner if you have any problems

## 2014-04-17 ENCOUNTER — Encounter: Payer: Self-pay | Admitting: Family Medicine

## 2014-04-17 DIAGNOSIS — I10 Essential (primary) hypertension: Secondary | ICD-10-CM

## 2014-04-18 MED ORDER — TRIAMTERENE-HCTZ 75-50 MG PO TABS: ORAL_TABLET | ORAL | Status: DC

## 2014-05-05 ENCOUNTER — Telehealth: Payer: Self-pay | Admitting: Emergency Medicine

## 2014-05-05 MED ORDER — HYDROCODONE-HOMATROPINE 5-1.5 MG/5ML PO SYRP: 5.0000 mL | ORAL_SOLUTION | Freq: Four times a day (QID) | ORAL | Status: DC | PRN

## 2014-05-05 NOTE — Telephone Encounter (Signed)
Spoke with pt, he is aware of recs and will come to office to pick up rx.  Nothing further needed.

## 2014-05-05 NOTE — Telephone Encounter (Signed)
Spoke with pt, he c/o nonprod cough X2 days, sinus congestion, pnd.  Is requesting a cough suppressant and any recs.  States he's been doing sinus saline rinses 1-2x daily, has helped some.  Has tried otc cough syrup, has suppressed cough some but not completely.  Any meds sent need to go to [facility] [address] [place].  Since RB is on vacation, sending to doc of day for recommendations.   Dr. Joya Gaskins please advise.  Thanks!

## 2014-05-05 NOTE — Telephone Encounter (Signed)
Trial hycodan 5ML every 4-6 hours as needed for cough

## 2014-05-12 ENCOUNTER — Other Ambulatory Visit: Payer: Self-pay | Admitting: Family Medicine

## 2014-05-19 ENCOUNTER — Ambulatory Visit (INDEPENDENT_AMBULATORY_CARE_PROVIDER_SITE_OTHER): Payer: Medicare PPO | Admitting: Family Medicine

## 2014-05-19 ENCOUNTER — Encounter: Payer: Self-pay | Admitting: Family Medicine

## 2014-05-19 ENCOUNTER — Ambulatory Visit: Payer: Medicare PPO | Admitting: Family Medicine

## 2014-05-19 VITALS — BP 110/80 | Temp 97.8°F | Wt 179.0 lb

## 2014-05-19 DIAGNOSIS — L259 Unspecified contact dermatitis, unspecified cause: Secondary | ICD-10-CM | POA: Insufficient documentation

## 2014-05-19 NOTE — Progress Notes (Signed)
   Subjective:    Patient ID: [name], male    DOB: 03-25-1941, 74 y.o.   MRN: [id number]  HPI [name] is a 74 year old male who comes in today for evaluation of a rash on his upper and lower eyelids  He developed this about 3 or 4 weeks ago and went to his ophthalmologist. He was diagnosed with a contact dermatitis. Use small amounts the cream twice daily the rash went away and then it came back. He's curious to note is anything else causing the problem.  Review of systems otherwise negative   Review of Systems    review of systems negative vision normal Objective:   Physical Exam  Well-developed well-nourished male no acute distress examination of the eyes are normal the upper and lower lids have some erythema to the consistent with a contact dermatitis      Assessment & Plan:  Contact dermatitis etiology unknown........ agree with ophthalmologist use small amounts of steroid cream twice daily.

## 2014-05-19 NOTE — Progress Notes (Signed)
Pre visit review using our clinic review tool, if applicable. No additional management support is needed unless otherwise documented below in the visit note. 

## 2014-05-19 NOTE — Patient Instructions (Signed)
Small amounts of the steroid cream that the ophthalmologist gave you twice daily till the rash is gone

## 2014-07-16 ENCOUNTER — Other Ambulatory Visit (INDEPENDENT_AMBULATORY_CARE_PROVIDER_SITE_OTHER): Payer: Medicare PPO

## 2014-07-16 DIAGNOSIS — E785 Hyperlipidemia, unspecified: Secondary | ICD-10-CM | POA: Diagnosis not present

## 2014-07-16 DIAGNOSIS — N401 Enlarged prostate with lower urinary tract symptoms: Secondary | ICD-10-CM

## 2014-07-16 DIAGNOSIS — Z0000 Encounter for general adult medical examination without abnormal findings: Secondary | ICD-10-CM | POA: Diagnosis not present

## 2014-07-16 LAB — CBC WITH DIFFERENTIAL/PLATELET
BASOS ABS: 0 10*3/uL (ref 0.0–0.1)
BASOS PCT: 0.4 % (ref 0.0–3.0)
EOS ABS: 0.2 10*3/uL (ref 0.0–0.7)
Eosinophils Relative: 2.7 % (ref 0.0–5.0)
HEMATOCRIT: 51.3 % (ref 39.0–52.0)
HEMOGLOBIN: 17.5 g/dL — AB (ref 13.0–17.0)
LYMPHS ABS: 2.1 10*3/uL (ref 0.7–4.0)
Lymphocytes Relative: 26.1 % (ref 12.0–46.0)
MCHC: 34.2 g/dL (ref 30.0–36.0)
MCV: 89.2 fl (ref 78.0–100.0)
Monocytes Absolute: 0.6 10*3/uL (ref 0.1–1.0)
Monocytes Relative: 7.6 % (ref 3.0–12.0)
NEUTROS ABS: 5.1 10*3/uL (ref 1.4–7.7)
Neutrophils Relative %: 63.2 % (ref 43.0–77.0)
Platelets: 209 10*3/uL (ref 150.0–400.0)
RBC: 5.75 Mil/uL (ref 4.22–5.81)
RDW: 13.9 % (ref 11.5–15.5)
WBC: 8.1 10*3/uL (ref 4.0–10.5)

## 2014-07-16 LAB — HEPATIC FUNCTION PANEL
ALBUMIN: 4.5 g/dL (ref 3.5–5.2)
ALT: 17 U/L (ref 0–53)
AST: 34 U/L (ref 0–37)
Alkaline Phosphatase: 97 U/L (ref 39–117)
BILIRUBIN DIRECT: 0.1 mg/dL (ref 0.0–0.3)
Total Bilirubin: 0.5 mg/dL (ref 0.2–1.2)
Total Protein: 7.1 g/dL (ref 6.0–8.3)

## 2014-07-16 LAB — POCT URINALYSIS DIPSTICK
Bilirubin, UA: NEGATIVE
Glucose, UA: NEGATIVE
Ketones, UA: NEGATIVE
Leukocytes, UA: NEGATIVE
NITRITE UA: NEGATIVE
Protein, UA: NEGATIVE
RBC UA: NEGATIVE
Spec Grav, UA: 1.01
UROBILINOGEN UA: 0.2
pH, UA: 5.5

## 2014-07-16 LAB — PSA: PSA: 0.58 ng/mL (ref 0.10–4.00)

## 2014-07-16 LAB — LIPID PANEL
Cholesterol: 216 mg/dL — ABNORMAL HIGH (ref 0–200)
HDL: 48.9 mg/dL (ref >39.00)
NONHDL: 167.1
Total CHOL/HDL Ratio: 4
Triglycerides: 259 mg/dL — ABNORMAL HIGH (ref 0.0–149.0)
VLDL: 51.8 mg/dL — AB (ref 0.0–40.0)

## 2014-07-16 LAB — BASIC METABOLIC PANEL
BUN: 28 mg/dL — ABNORMAL HIGH (ref 6–23)
CALCIUM: 9.6 mg/dL (ref 8.4–10.5)
CO2: 32 mEq/L (ref 19–32)
Chloride: 101 mEq/L (ref 96–112)
Creatinine, Ser: 1.29 mg/dL (ref 0.40–1.50)
GFR: 58.07 mL/min — AB (ref >60.00)
GLUCOSE: 93 mg/dL (ref 70–99)
POTASSIUM: 3.9 meq/L (ref 3.5–5.1)
SODIUM: 140 meq/L (ref 135–145)

## 2014-07-16 LAB — LDL CHOLESTEROL, DIRECT: LDL DIRECT: 118 mg/dL

## 2014-07-16 LAB — TSH: TSH: 2.42 u[IU]/mL (ref 0.35–4.50)

## 2014-07-23 ENCOUNTER — Encounter: Payer: Medicare PPO | Admitting: Family Medicine

## 2014-07-30 ENCOUNTER — Ambulatory Visit (INDEPENDENT_AMBULATORY_CARE_PROVIDER_SITE_OTHER): Payer: Medicare PPO | Admitting: Family Medicine

## 2014-07-30 ENCOUNTER — Encounter: Payer: Self-pay | Admitting: Family Medicine

## 2014-07-30 VITALS — BP 130/80 | Temp 98.2°F | Ht 69.5 in | Wt 180.0 lb

## 2014-07-30 DIAGNOSIS — M1A9XX1 Chronic gout, unspecified, with tophus (tophi): Secondary | ICD-10-CM

## 2014-07-30 DIAGNOSIS — M10079 Idiopathic gout, unspecified ankle and foot: Secondary | ICD-10-CM

## 2014-07-30 DIAGNOSIS — F419 Anxiety disorder, unspecified: Secondary | ICD-10-CM

## 2014-07-30 DIAGNOSIS — J449 Chronic obstructive pulmonary disease, unspecified: Secondary | ICD-10-CM

## 2014-07-30 DIAGNOSIS — M1612 Unilateral primary osteoarthritis, left hip: Secondary | ICD-10-CM

## 2014-07-30 DIAGNOSIS — J309 Allergic rhinitis, unspecified: Secondary | ICD-10-CM

## 2014-07-30 DIAGNOSIS — E785 Hyperlipidemia, unspecified: Secondary | ICD-10-CM

## 2014-07-30 DIAGNOSIS — N529 Male erectile dysfunction, unspecified: Secondary | ICD-10-CM

## 2014-07-30 DIAGNOSIS — M1009 Idiopathic gout, multiple sites: Secondary | ICD-10-CM

## 2014-07-30 DIAGNOSIS — J45909 Unspecified asthma, uncomplicated: Secondary | ICD-10-CM

## 2014-07-30 DIAGNOSIS — IMO0002 Reserved for concepts with insufficient information to code with codable children: Secondary | ICD-10-CM

## 2014-07-30 DIAGNOSIS — M179 Osteoarthritis of knee, unspecified: Secondary | ICD-10-CM

## 2014-07-30 DIAGNOSIS — M545 Low back pain, unspecified: Secondary | ICD-10-CM

## 2014-07-30 DIAGNOSIS — G4700 Insomnia, unspecified: Secondary | ICD-10-CM

## 2014-07-30 DIAGNOSIS — M810 Age-related osteoporosis without current pathological fracture: Secondary | ICD-10-CM

## 2014-07-30 DIAGNOSIS — K219 Gastro-esophageal reflux disease without esophagitis: Secondary | ICD-10-CM

## 2014-07-30 DIAGNOSIS — M109 Gout, unspecified: Secondary | ICD-10-CM

## 2014-07-30 DIAGNOSIS — R351 Nocturia: Secondary | ICD-10-CM

## 2014-07-30 DIAGNOSIS — I10 Essential (primary) hypertension: Secondary | ICD-10-CM

## 2014-07-30 DIAGNOSIS — N528 Other male erectile dysfunction: Secondary | ICD-10-CM

## 2014-07-30 DIAGNOSIS — N401 Enlarged prostate with lower urinary tract symptoms: Secondary | ICD-10-CM

## 2014-07-30 DIAGNOSIS — N400 Enlarged prostate without lower urinary tract symptoms: Secondary | ICD-10-CM

## 2014-07-30 DIAGNOSIS — M1710 Unilateral primary osteoarthritis, unspecified knee: Secondary | ICD-10-CM

## 2014-07-30 DIAGNOSIS — J441 Chronic obstructive pulmonary disease with (acute) exacerbation: Secondary | ICD-10-CM

## 2014-07-30 DIAGNOSIS — N411 Chronic prostatitis: Secondary | ICD-10-CM

## 2014-07-30 DIAGNOSIS — N521 Erectile dysfunction due to diseases classified elsewhere: Secondary | ICD-10-CM

## 2014-07-30 MED ORDER — ATORVASTATIN CALCIUM 40 MG PO TABS: 40.0000 mg | ORAL_TABLET | Freq: Every evening | ORAL | Status: DC

## 2014-07-30 MED ORDER — AMLODIPINE BESYLATE 5 MG PO TABS: 5.0000 mg | ORAL_TABLET | Freq: Every day | ORAL | Status: DC

## 2014-07-30 MED ORDER — VARDENAFIL HCL 20 MG PO TABS: 20.0000 mg | ORAL_TABLET | Freq: Every day | ORAL | Status: DC | PRN

## 2014-07-30 MED ORDER — AMITRIPTYLINE HCL 25 MG PO TABS: 25.0000 mg | ORAL_TABLET | Freq: Every day | ORAL | Status: DC

## 2014-07-30 MED ORDER — PANTOPRAZOLE SODIUM 40 MG PO TBEC: 40.0000 mg | DELAYED_RELEASE_TABLET | Freq: Two times a day (BID) | ORAL | Status: DC

## 2014-07-30 MED ORDER — FLUTICASONE PROPIONATE 50 MCG/ACT NA SUSP: 2.0000 | Freq: Every day | NASAL | Status: DC

## 2014-07-30 MED ORDER — TRIAMTERENE-HCTZ 75-50 MG PO TABS: ORAL_TABLET | ORAL | Status: DC

## 2014-07-30 MED ORDER — ATENOLOL 50 MG PO TABS: 50.0000 mg | ORAL_TABLET | Freq: Every day | ORAL | Status: DC

## 2014-07-30 MED ORDER — LOSARTAN POTASSIUM 25 MG PO TABS: 25.0000 mg | ORAL_TABLET | Freq: Every day | ORAL | Status: DC

## 2014-07-30 NOTE — Progress Notes (Signed)
   Subjective:    Patient ID: [name], male    DOB: 12-30-1940, 74 y.o.   MRN: [id number]  HPI  [name] is a 74 year old male married nonsmoker who comes in today for general physical examination because of a history of gout, hypertension, hyperlipidemia, COPD, chronic back pain, allergic rhinitis, osteoarthritis, reflux esophagitis and erectile dysfunction   His med list reviewed its correct  He gets routine eye care, dental care,,,,, upper and lower dentures,,,,,, , colonoscopy and GI  Vaccinations up-to-date   Cognitive function normal he walks on a regular basis home health safety reviewed no issues identified, no guns in the house, he does have a healthcare power of attorney and living well   Review of Systems  Constitutional: Negative.   HENT: Negative.   Eyes: Negative.   Respiratory: Negative.   Cardiovascular: Negative.   Gastrointestinal: Negative.   Endocrine: Negative.   Genitourinary: Negative.   Musculoskeletal: Negative.   Skin: Negative.   Allergic/Immunologic: Negative.   Neurological: Negative.   Hematological: Negative.   Psychiatric/Behavioral: Negative.        Objective:   Physical Exam  Constitutional: He is oriented to person, place, and time. He appears well-developed and well-nourished.  HENT:  Head: Normocephalic and atraumatic.  Right Ear: External ear normal.  Left Ear: External ear normal.  Nose: Nose normal.  Mouth/Throat: Oropharynx is clear and moist.   Upper and lower dentures  Eyes: Conjunctivae and EOM are normal. Pupils are equal, round, and reactive to light.  Neck: Normal range of motion. Neck supple. No JVD present. No tracheal deviation present. No thyromegaly present.  Cardiovascular: Normal rate, regular rhythm, normal heart sounds and intact distal pulses.  Exam reveals no gallop and no friction rub.   No murmur heard. Pulmonary/Chest: Effort normal and breath sounds normal. No stridor. No respiratory distress. He has no  wheezes. He has no rales. He exhibits no tenderness.  Abdominal: Soft. Bowel sounds are normal. He exhibits no distension and no mass. There is no tenderness. There is no rebound and no guarding.  Genitourinary: Rectum normal and penis normal. Guaiac negative stool. No penile tenderness.   2+ symmetrical nonnodular BPH,,,,,,,, TURP 5 years ago by Dr. Terance Hart  Musculoskeletal: Normal range of motion. He exhibits no edema or tenderness.  Lymphadenopathy:    He has no cervical adenopathy.  Neurological: He is alert and oriented to person, place, and time. He has normal reflexes. No cranial nerve deficit. He exhibits normal muscle tone.  Skin: Skin is warm and dry. No rash noted. No erythema. No pallor.   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 His behavior is normal. Judgment and thought content normal.  Nursing note and vitals reviewed.         Assessment & Plan:   hypertension at goal,,,, continue current therapy  Hyperlipidemia goal,,, continue current therapy  History gout,,,,,, continue allopurinol  COPD,,,,,,,, continue inhalers  Allergic rhinitis,,,,, continue medication  Osteoarthritis,,,,,, continue Relafen 500 mg twice a day  Reflux esophagitis,,,,,,,, continue proton X 40 mg daily   Erectile dysfunction refill Levitra

## 2014-07-30 NOTE — Progress Notes (Signed)
Pre visit review using our clinic review tool, if applicable. No additional management support is needed unless otherwise documented below in the visit note. 

## 2014-07-30 NOTE — Patient Instructions (Signed)
Continue current medications  Follow-up in one year sooner if any problem

## 2014-09-03 ENCOUNTER — Other Ambulatory Visit: Payer: Self-pay | Admitting: Family Medicine

## 2014-09-16 ENCOUNTER — Other Ambulatory Visit: Payer: Self-pay | Admitting: Family Medicine

## 2014-09-16 ENCOUNTER — Encounter: Payer: Self-pay | Admitting: Family Medicine

## 2014-09-20 ENCOUNTER — Encounter: Payer: Self-pay | Admitting: Family Medicine

## 2014-09-20 ENCOUNTER — Other Ambulatory Visit: Payer: Self-pay | Admitting: Family Medicine

## 2014-09-22 MED ORDER — TIOTROPIUM BROMIDE MONOHYDRATE 18 MCG IN CAPS: ORAL_CAPSULE | RESPIRATORY_TRACT | Status: DC

## 2014-09-23 ENCOUNTER — Ambulatory Visit (INDEPENDENT_AMBULATORY_CARE_PROVIDER_SITE_OTHER): Payer: Medicare PPO | Admitting: Emergency Medicine

## 2014-09-23 ENCOUNTER — Encounter: Payer: Self-pay | Admitting: Emergency Medicine

## 2014-09-23 VITALS — BP 118/78 | HR 65 | Ht 70.0 in | Wt 176.8 lb

## 2014-09-23 DIAGNOSIS — J449 Chronic obstructive pulmonary disease, unspecified: Secondary | ICD-10-CM | POA: Diagnosis not present

## 2014-09-23 NOTE — Assessment & Plan Note (Addendum)
Discussed his functional capacity in detail and reviewed his past borderline function testing with him today. He has lost some of his exertional tolerance but is unclear to him or to me whether this is because he is needed to decrease his exercise routine or whether this represents progression of his lung disease. This time we agreed that he would try to increase his exercise routine back to what he was used to before he is dealing with neck injury. He will continue the same bronchodilators but he will start taking the Symbicort first to hopefully improve the deposition of Spiriva every day. We will follow-up in 3 months to assess his status. At that time we may decide to repeat his primary function testing or to try alternative bronchodilator regimen.  Over 80% of this 25 minute visit was spent counseling regarding the patient's underlying lung disease, functional capacity, strategies for improving his exercise tolerance.

## 2014-09-23 NOTE — Patient Instructions (Addendum)
Please continue your same medications  Start taking your Symbicort first in the am, then take your spiriva 10-15 minutes later.  Follow with Dr Lamonte Sakai in 3 months or sooner if you have any problems.

## 2014-09-23 NOTE — Progress Notes (Signed)
74 yo man with COPD, allergic rhinitis, GERD.   ROV 06/29/09 -- regular f/u for COPD. Has had some URI signs last several days. Having cough, scant sputum. Some nasal congestion. Not really manifesting itself as a change in his breathing, wheeze. Has been using mucinex. Overall breathing has been stable, he doesn't feels that he needs any addition to his maintanance regimen.   June 28, 2010 --Presents for an acute office visit.Complains of a dull ache in upper left chest wall x60months, described as "not acute, just there." He was seen by cardiology couple of weeks ago felt to be noncardiac pain. Also seen by PCP ?musculoskeletal pain. Says over last several weeks his breathing not as good as normal , has had to use rescue inhaler more often. NO calf pain or recent travel. Some wheezing intermittently.Denies orthopnea, hemoptysis, fever, n/v/d, edema, headache.  Describes as fleeting chest pain-dull at times. Non exertional. No pain on inspiration. Intermittently tender  Has increased reflux despite two times a day PPI. Has used Advil on /off but not on consitstent basis with some help.  ROV 05/30/12 -- hx COPD, allergies, GERD. Last seen by me in 2011. Returns to reestablish care. Remains on Spiriva qd, rarely uses SABA. He is having more difficulty exercising. He recently did a 6 minute walk at [place] and desaturated after 4 minutes. He wants to start pulm rehab. Denies mucous production, minimal wheeze, rare cough. He has flared once since I've seen him - recommended pred but he didn't take because poorly tolerated. Then treated with levaquin for bronchitis in 12/13.   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 Hypoxemia on exertion (newly identified). S/p spiro today, FEV1 stable compared with 02/2009. He is working with PT. He has desaturated and will qualify for O2 w exertion.  He is feeling well. Is willing to try LABA/ICS and to start O2.   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 Hypoxemia on exertion. Last time we started O2 w exertion, also added Symbicort to spiriva. Has been compliant w O2. Tolerating the BD's. His SABA use has decreased with addition of symbicort. He finished pulm rehab and is going to start silver sneakers.   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 Hypoxemia on exertion. Maintained on spiriva + symbicort. He has been active, doing cardio at [facility], strength conditioning at the gym. He is doing well, states that breathing is stable. His endurance is better. Using O2 with heavy exertion. Flu shot up to day.   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 Hypoxemia on exertion. He has been wearing his O2 with exertion - trimming hedges. He is on Symbicort + Spiriva.  He is on zyrtec and fluticasone. He stays active. His breathing may not be quite as good as last year this time. No flares. No hospitalizations. Uses albuterol.   ROV 01/13/14 -- follow up visit for Stage b-c COPD, hypoxemia. He believes that he has done fairly well, but with some decline this Spring > increased cough, non-productive. Hoarseness and nasal sinus congestion. Having ear fullness and ringing. He has taken his zyrtec, flonase. He takes afrin and saline every night. 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   ROV 04/08/14 -- follow up visit for his COPD, sinus congestion. Last time we treated with prednisone for his congestion and Meniere's. He has been taking flonase bid, nsw. He is on symbicort. Very rare albuterol. He uses O2 with heavy exertion. He is active, does cardio 4-5x a week. He has some scratchy throat.   ROV 09/23/14 -- follow up visit for COPD and rhinitis / allergies. He wears his o2 when he does cardio exercise.  He is still able to exert, but  probably has a small decrease in functional capacity. He remains on spiriva, symbicort, albuterol prn > uses rarely, although more in the last 2-3 weeks. He is still on zyrtec and fluticasone. He has not had any flares.    Filed Vitals:   09/23/14 1647  BP: 118/78  Pulse: 65  Height: 5\' 10"  (1.778 m)  Weight: 176 lb 12.8 oz (80.196 kg)  SpO2: 93%    Gen: Pleasant, well-nourished, in no distress,  normal affect  ENT: No lesions,  mouth clear,  oropharynx clear, no postnasal drip  Neck: No JVD, no TMG, no carotid bruits  Lungs: No use of accessory muscles, clear without rales or rhonchi  Cardiovascular: RRR, heart sounds normal, no murmur or gallops, no peripheral edema  Musculoskeletal: No deformities, no cyanosis or clubbing  Neuro: alert, non focal  Skin: Warm, no lesions or rashes  COPD (chronic obstructive pulmonary disease) Discussed his functional capacity in detail and reviewed his past borderline function testing with him today. He has lost some of his exertional tolerance but is unclear to him or to me whether this is because he is needed to decrease his exercise routine or whether this represents progression of his lung disease. This time we agreed that he would try to increase his exercise routine back to what he was used to before he is dealing with neck injury. He will continue the same bronchodilators but he will start taking the Symbicort first to hopefully improve the deposition of Spiriva every day. We will follow-up in 3 months to assess his status. At that time we may decide to repeat his primary function testing or to try alternative bronchodilator regimen.  Over 80% of this 25 minute visit was spent counseling regarding the patient's underlying lung disease, functional capacity, strategies for improving his exercise tolerance.   ALLERGIC RHINITIS Continue same regimen

## 2014-09-23 NOTE — Assessment & Plan Note (Signed)
Continue same regimen 

## 2014-10-27 ENCOUNTER — Other Ambulatory Visit: Payer: Self-pay | Admitting: Family Medicine

## 2014-10-27 ENCOUNTER — Encounter: Payer: Self-pay | Admitting: Family Medicine

## 2014-10-27 DIAGNOSIS — J449 Chronic obstructive pulmonary disease, unspecified: Secondary | ICD-10-CM

## 2014-10-28 MED ORDER — ALBUTEROL SULFATE HFA 108 (90 BASE) MCG/ACT IN AERS: 1.0000 | INHALATION_SPRAY | RESPIRATORY_TRACT | Status: DC | PRN

## 2014-10-28 NOTE — Telephone Encounter (Signed)
rx sent

## 2014-11-12 ENCOUNTER — Other Ambulatory Visit: Payer: Self-pay | Admitting: Family Medicine

## 2014-11-12 ENCOUNTER — Encounter: Payer: Self-pay | Admitting: Family Medicine

## 2014-12-04 ENCOUNTER — Encounter: Payer: Self-pay | Admitting: Family Medicine

## 2014-12-05 MED ORDER — AMOXICILLIN 875 MG PO TABS: 875.0000 mg | ORAL_TABLET | Freq: Two times a day (BID) | ORAL | Status: DC

## 2014-12-05 NOTE — Telephone Encounter (Signed)
Rx sent per Dr Sherren Mocha

## 2014-12-27 ENCOUNTER — Encounter: Payer: Self-pay | Admitting: Family Medicine

## 2014-12-29 MED ORDER — CEPHALEXIN 500 MG PO CAPS: 500.0000 mg | ORAL_CAPSULE | Freq: Four times a day (QID) | ORAL | Status: DC

## 2014-12-31 ENCOUNTER — Telehealth: Payer: Self-pay | Admitting: Gastroenterology

## 2014-12-31 NOTE — Telephone Encounter (Signed)
Left message for pt to call back.  Pt states he has noticed that for the past 2 months his stools are much thinner in shape. States they are about the size of a cigar. No blood and no pain. States he usually has 2 stools in the morning before he feels completely empty. Pt has been concerned due to the change in stool shape and wanted to see if he needed to be seen or do anything different. Please advise.

## 2014-12-31 NOTE — Telephone Encounter (Signed)
Spoke with pt and he is aware. Pt will try metamucil. Pt scheduled to see Dr. Ardis Hughs 03/03/15@8 :45am. Appt letter mailed to pt.

## 2014-12-31 NOTE — Telephone Encounter (Signed)
He had a colonoscopy 4 years ago, no precancerous polyps and so it is unlikely to be anything serious.   rov next available to discuss further in the office and recommend that he start once daily fiber supplement in the meantime.  Thanks

## 2015-01-06 ENCOUNTER — Telehealth: Payer: Self-pay | Admitting: Family Medicine

## 2015-01-06 ENCOUNTER — Encounter: Payer: Self-pay | Admitting: Emergency Medicine

## 2015-01-06 ENCOUNTER — Ambulatory Visit (INDEPENDENT_AMBULATORY_CARE_PROVIDER_SITE_OTHER): Payer: Medicare PPO | Admitting: Emergency Medicine

## 2015-01-06 VITALS — BP 136/74 | HR 63 | Ht 70.0 in | Wt 175.0 lb

## 2015-01-06 DIAGNOSIS — J449 Chronic obstructive pulmonary disease, unspecified: Secondary | ICD-10-CM | POA: Diagnosis not present

## 2015-01-06 DIAGNOSIS — F329 Major depressive disorder, single episode, unspecified: Secondary | ICD-10-CM | POA: Insufficient documentation

## 2015-01-06 DIAGNOSIS — F32A Depression, unspecified: Secondary | ICD-10-CM

## 2015-01-06 MED ORDER — ESCITALOPRAM OXALATE 5 MG PO TABS: 5.0000 mg | ORAL_TABLET | Freq: Every day | ORAL | Status: DC

## 2015-01-06 NOTE — Patient Instructions (Signed)
Please start taking Lexapro 5mg  daily. Discuss this medication with Dr Sherren Mocha and with Dr Damita Dunnings at [facility]. He may want you to increase to 10 mg at some point if you are tolerating and benefiting from it.  Please continue your Spiriva and Symbicort  Wear your oxygen with exertion.  Follow with Dr Lamonte Sakai in 3 months or sooner if you have any problems.

## 2015-01-06 NOTE — Assessment & Plan Note (Signed)
Patient describes depressed mood. He particularly relates this to his need for medications including his bronchodilators in order to live a normal life. He does not like being "dependent" on anything including medication. He has also had some episodes of panic and anxiety. Up till now he's dealt with these do relaxation techniques and meditation. He tells me that these techniques are no longer very effective. Importantly he also mentions that he's had a few occasions where he thought about suicide. He contracts for safety right now, denies frequent similar episodes. He tells me that he has does not wish to harm himself. Based on all of this I would like to start him on Lexapro 5 mg daily with the plan for him to follow-up with internal medicine, either Dr Sherren Mocha or his new PCP Dr Damita Dunnings, To discuss results and to potentially uptitrate to 10 mg. I think he should also speak with them about his entire medicine list and decide which metastatic continue based on the benefits and risks. I will see him back in 3 months to ensure stability.

## 2015-01-06 NOTE — Progress Notes (Signed)
74 yo man with COPD, allergic rhinitis, GERD.   ROV 06/29/09 -- regular f/u for COPD. Has had some URI signs last several days. Having cough, scant sputum. Some nasal congestion. Not really manifesting itself as a change in his breathing, wheeze. Has been using mucinex. Overall breathing has been stable, he doesn't feels that he needs any addition to his maintanance regimen.   June 28, 2010 --Presents for an acute office visit.Complains of a dull ache in upper left chest wall x41months, described as "not acute, just there." He was seen by cardiology couple of weeks ago felt to be noncardiac pain. Also seen by PCP ?musculoskeletal pain. Says over last several weeks his breathing not as good as normal , has had to use rescue inhaler more often. NO calf pain or recent travel. Some wheezing intermittently.Denies orthopnea, hemoptysis, fever, n/v/d, edema, headache.  Describes as fleeting chest pain-dull at times. Non exertional. No pain on inspiration. Intermittently tender  Has increased reflux despite two times a day PPI. Has used Advil on /off but not on consitstent basis with some help.  ROV 05/30/12 -- hx COPD, allergies, GERD. Last seen by me in 2011. Returns to reestablish care. Remains on Spiriva qd, rarely uses SABA. He is having more difficulty exercising. He recently did a 6 minute walk at [place] and desaturated after 4 minutes. He wants to start pulm rehab. Denies mucous production, minimal wheeze, rare cough. He has flared once since I've seen him - recommended pred but he didn't take because poorly tolerated. Then treated with levaquin for bronchitis in 12/13.   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 Hypoxemia on exertion (newly identified). S/p spiro today, FEV1 stable compared with 02/2009. He is working with PT. He has desaturated and will qualify for O2 w exertion.  He is feeling well. Is willing to try LABA/ICS and to start O2.   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 Hypoxemia on exertion. Last time we started O2 w exertion, also added Symbicort to spiriva. Has been compliant w O2. Tolerating the BD's. His SABA use has decreased with addition of symbicort. He finished pulm rehab and is going to start silver sneakers.   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 Hypoxemia on exertion. Maintained on spiriva + symbicort. He has been active, doing cardio at [facility], strength conditioning at the gym. He is doing well, states that breathing is stable. His endurance is better. Using O2 with heavy exertion. Flu shot up to day.   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 Hypoxemia on exertion. He has been wearing his O2 with exertion - trimming hedges. He is on Symbicort + Spiriva.  He is on zyrtec and fluticasone. He stays active. His breathing may not be quite as good as last year this time. No flares. No hospitalizations. Uses albuterol.   ROV 01/13/14 -- follow up visit for Stage b-c COPD, hypoxemia. He believes that he has done fairly well, but with some decline this Spring > increased cough, non-productive. Hoarseness and nasal sinus congestion. Having ear fullness and ringing. He has taken his zyrtec, flonase. He takes afrin and saline every night. 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   ROV 04/08/14 -- follow up visit for his COPD, sinus congestion. Last time we treated with prednisone for his congestion and Meniere's. He has been taking flonase bid, nsw. He is on symbicort. Very rare albuterol. He uses O2 with heavy exertion. He is active, does cardio 4-5x a week. He has some scratchy throat.   ROV 09/23/14 -- follow up visit for COPD and rhinitis / allergies. He wears his o2 when he does cardio exercise.  He is still able to exert, but  probably has a small decrease in functional capacity. He remains on spiriva, symbicort, albuterol prn > uses rarely, although more in the last 2-3 weeks. He is still on zyrtec and fluticasone. He has not had any flares.   ROV 01/06/15 -- follows up for COPD, allergic rhinitis, exertional hypoxemia and chronic hypoxemic risk for failure. At her last visit he was concerned that he is losing some of his functional capacity.  He has started taking his Symbicort before his Spiriva. He doesn't believe that his functional capacity has changed very much. He has been using his O2 with exertion, cardio exercise.  He relates that he has had some sx of depression, anxiety attacks. Has on at least a few occasions of suicidal ideation.                              Filed Vitals:   01/06/15 0858  BP: 136/74  Pulse: 63  Height: 5\' 10"  (1.778 m)  Weight: 175 lb (79.379 kg)  SpO2: 98%    Gen: Pleasant, well-nourished, in no distress,  normal affect  ENT: No lesions,  mouth clear,  oropharynx clear, no postnasal drip  Neck: No JVD, no TMG, no carotid bruits  Lungs: No use of accessory muscles, clear without rales or rhonchi  Cardiovascular: RRR, heart sounds normal, no murmur or gallops, no peripheral edema  Musculoskeletal: No deformities, no cyanosis or clubbing  Neuro: alert, non focal  Skin: Warm, no lesions or rashes  Depression Patient describes depressed mood. He particularly relates this to his need for medications including his bronchodilators in order to live a normal life. He does not like being "dependent" on anything including medication. He has also had some episodes of panic and anxiety. Up till now he's dealt with these do relaxation techniques and meditation. He tells me that these techniques are no longer very effective. Importantly he also mentions that he's had a few occasions where he thought about suicide. He contracts for safety right now, denies frequent similar episodes. He tells  me that he has does not wish to harm himself. Based on all of this I would like to start him on Lexapro 5 mg daily with the plan for him to follow-up with internal medicine, either Dr Sherren Mocha or his new PCP Dr Damita Dunnings, To discuss results and to potentially uptitrate to 10 mg. I think he should also speak with them about his entire medicine list and decide which metastatic continue based on the benefits and risks. I will see him back in 3 months to ensure stability.   COPD (chronic obstructive pulmonary disease) We will continue Spiriva and Symbicort as he has been taking it. Depending on how he is doing after we treat his depression we may still consider altering his bronchodilators.

## 2015-01-06 NOTE — Telephone Encounter (Signed)
Elise @ [facility] pul[facility] called Dr Brock Ra wanted to get mr Mcconahy in sooner than feb as new patient  Can I move pt to a sooner appointment

## 2015-01-06 NOTE — Assessment & Plan Note (Signed)
We will continue Spiriva and Symbicort as he has been taking it. Depending on how he is doing after we treat his depression we may still consider altering his bronchodilators.

## 2015-01-07 ENCOUNTER — Encounter: Payer: Self-pay | Admitting: Emergency Medicine

## 2015-01-07 NOTE — Telephone Encounter (Signed)
left message asking pt to call office please let pt know appointment date and time has been changed per dr bryum request 8/24/rbh  Appointment 8/30 @ 2 Daneil Dan @ [facility] aware appointment has been moved up

## 2015-01-07 NOTE — Telephone Encounter (Signed)
Pt aware of appointment date and time change

## 2015-01-07 NOTE — Telephone Encounter (Signed)
Needs 30 min OV next week.  Thanks.

## 2015-01-07 NOTE — Telephone Encounter (Signed)
Per pt email sent in today: Dr Lamonte Sakai, I got scheduled with Dr Damita Dunnings for August 30 @ 2pm regarding my taking Lexapro. Thanks.Marland KitchenMarland KitchenEd Real   FYI to [employer]

## 2015-01-13 ENCOUNTER — Ambulatory Visit: Payer: Medicare PPO | Admitting: Adult Health

## 2015-01-13 ENCOUNTER — Encounter: Payer: Self-pay | Admitting: Family Medicine

## 2015-01-13 ENCOUNTER — Ambulatory Visit: Payer: Medicare PPO | Admitting: Family Medicine

## 2015-01-13 ENCOUNTER — Ambulatory Visit (INDEPENDENT_AMBULATORY_CARE_PROVIDER_SITE_OTHER): Payer: Medicare PPO | Admitting: Family Medicine

## 2015-01-13 VITALS — BP 122/68 | HR 61 | Temp 98.7°F | Ht 70.0 in | Wt 173.0 lb

## 2015-01-13 DIAGNOSIS — F32A Depression, unspecified: Secondary | ICD-10-CM

## 2015-01-13 DIAGNOSIS — F329 Major depressive disorder, single episode, unspecified: Secondary | ICD-10-CM | POA: Diagnosis not present

## 2015-01-13 MED ORDER — ESCITALOPRAM OXALATE 10 MG PO TABS: 10.0000 mg | ORAL_TABLET | Freq: Every day | ORAL | Status: DC

## 2015-01-13 NOTE — Patient Instructions (Signed)
Up the lexapro to 10mg  a day and update me in about 1 week.  Take care.  Glad to see you.

## 2015-01-13 NOTE — Progress Notes (Signed)
Pre visit review using our clinic review tool, if applicable. No additional management support is needed unless otherwise documented below in the visit note.  New patient to me.  F/u for depression.  Longstanding dec in mood, worse progressively.  To the point of prev suicidal consideration prev.  No SI/HI now.  Contracts for safety.  No illicit use.  No etoh.   More tearful, more claustrophobia.  Sleep disrupted.  More panic sx.   Pt d/w Dr. Lamonte Sakai, started on lexapro, sig improvement 1st 3 days of tx, effect levelled off after that.  No ADE on med.   Episodic O2 dependence, has had pulmonary f/u.   Chronic pain from L hand injury prev noted.   Has support from wife.   PMH and SH reviewed  ROS: See HPI, otherwise noncontributory.  Meds, vitals, and allergies reviewed.   GEN: nad, alert and oriented, speech and judgement wnl.  Affect slightly flat HEENT: mucous membranes moist NECK: supple w/o LA CV: rrr.  PULM: ctab, no inc wob ABD: soft, +bs EXT: no edema SKIN: no acute rash Chronic changes L hand noted.  No radial pulse felt on L hand but has ulnar pulse- this is baseline per patient.

## 2015-01-14 ENCOUNTER — Encounter: Payer: Self-pay | Admitting: Family Medicine

## 2015-01-14 NOTE — Assessment & Plan Note (Signed)
>  40 minutes spent in face to face time with patient, >50% spent in counselling or coordination of care.  Would continue lexapro, would in to 10mg  a day.  rx sent.  Meets criteria for MDD, okay for outpatient f/u.  He'll update me next week.   We may need to inc to 20mg  lexapro in the future, depending on his sx.   He agrees with plan.  NO SI/HI, contracts for safety.

## 2015-01-20 ENCOUNTER — Encounter: Payer: Self-pay | Admitting: Family Medicine

## 2015-02-10 ENCOUNTER — Encounter: Payer: Self-pay | Admitting: Family Medicine

## 2015-02-17 ENCOUNTER — Encounter: Payer: Self-pay | Admitting: Family Medicine

## 2015-03-03 ENCOUNTER — Other Ambulatory Visit: Payer: Self-pay | Admitting: Physician Assistant

## 2015-03-03 ENCOUNTER — Encounter: Payer: Self-pay | Admitting: Gastroenterology

## 2015-03-03 ENCOUNTER — Ambulatory Visit (INDEPENDENT_AMBULATORY_CARE_PROVIDER_SITE_OTHER): Payer: Medicare PPO | Admitting: Gastroenterology

## 2015-03-03 ENCOUNTER — Ambulatory Visit: Payer: Medicare PPO | Admitting: Gastroenterology

## 2015-03-03 VITALS — BP 130/72 | HR 68 | Ht 70.0 in | Wt 180.0 lb

## 2015-03-03 DIAGNOSIS — R194 Change in bowel habit: Secondary | ICD-10-CM | POA: Diagnosis not present

## 2015-03-03 NOTE — Progress Notes (Signed)
Review of pertinent gastrointestinal problems: 1. Personal history of tubular adenomas. Colonoscopy 3/07 with small TAs, next recall 07/2010. 09/2010 colonoscopy, Ardis Hughs, TICs, diminutive polyp that was not adenomatous. Recommended recall at 5 years. 2. Schatzki's ring, dilated to 20 mm with a balloon during EGD August, 2009. Very difficult IV access patient needed PICC line placed prior to this procedure  3. GERD: Rather severe symptoms, spring 2010 symptoms controlled on twice daily PPI as well as bedtime H2 blocker.  4. Gastritis: reactive, noted on EGD above, biopsies showed no H. Pylori 5. Bloating 2014 led to celiac sprue testing, serologies all negative, bowels improved with dietary changes (was eating too much fiber supplement)  HPI: This is a    very pleasant 74 year old man whom I last saw about 2 years ago  Chief complaint is change in bowel habits  He noticed a change in his bowels recently, thinner than usual.  Was fragmented BM as well.  Not seeing any blood.  Started a newer type of fiber.  He is back to normal now.  Goes 2 to 3 times per day.  Overall stable weight.  Since starting the new fiber supplement he has noted a bit of extra gassiness.    Past Medical History  Diagnosis Date  . GERD (gastroesophageal reflux disease)   . Hyperlipidemia   . Hypertension   . Allergic rhinitis   . Benign prostatic hypertrophy   . COPD (chronic obstructive pulmonary disease) ([place])   . Osteoarthritis     DJD  . ED (erectile dysfunction)   . BPH (benign prostatic hyperplasia)   . 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 Meniere disease     Past Surgical History  Procedure Laterality Date  . Right inguinal hernia repair    . Tonsillectomy    . Transurethral resection of prostate    . Thumb amputation      and fasciotomy L hand    Current Outpatient Prescriptions  Medication Sig Dispense Refill  . albuterol (PROAIR HFA) 108 (90 BASE) MCG/ACT  inhaler Inhale 1 puff into the lungs every 4 (four) hours as needed for wheezing. 2 Inhaler 3  . allopurinol (ZYLOPRIM) 300 MG tablet take 1/2 tablet by mouth once daily 100 tablet 1  . amitriptyline (ELAVIL) 25 MG tablet Take 1 tablet (25 mg total) by mouth at bedtime. 100 tablet 3  . amLODipine (NORVASC) 5 MG tablet Take 1 tablet (5 mg total) by mouth daily. 100 tablet 3  . atenolol (TENORMIN) 50 MG tablet Take 1 tablet (50 mg total) by mouth daily. 100 tablet 3  . atorvastatin (LIPITOR) 40 MG tablet Take 1 tablet (40 mg total) by mouth every evening. 100 tablet 3  . cetirizine (ZYRTEC) 10 MG tablet Take 10 mg by mouth daily.      . clobetasol (TEMOVATE) 0.05 % external solution Apply topically as needed.    . cyclobenzaprine (FLEXERIL) 10 MG tablet take 1 tablet by mouth at bedtime 30 tablet 5  . escitalopram (LEXAPRO) 10 MG tablet Take 1 tablet (10 mg total) by mouth daily. 90 tablet 3  . fluticasone (FLONASE) 50 MCG/ACT nasal spray Place 2 sprays into both nostrils daily. Each nostril. 48 g 11  . ketoconazole (NIZORAL) 200 MG tablet Take 1 tablet (200 mg total) by mouth daily as needed. 40 tablet 3  . losartan (COZAAR) 25 MG tablet Take 1 tablet (25 mg total) by mouth daily. 100 tablet 3  .  nabumetone (RELAFEN) 500 MG tablet take 1 tablet by mouth twice a day 200 tablet 4  . pantoprazole (PROTONIX) 40 MG tablet Take 1 tablet (40 mg total) by mouth 2 (two) times daily. 200 tablet 3  . potassium chloride SA (K-DUR,KLOR-CON) 20 MEQ tablet take 1 tablet by mouth once daily 100 tablet 3  . SAW PALMETTO, SERENOA REPENS, PO Take 1 capsule by mouth daily.      . SYMBICORT 160-4.5 MCG/ACT inhaler inhale 2 puffs INTO THE LUNGS twice a day 30.6 g 4  . tiotropium (SPIRIVA HANDIHALER) 18 MCG inhalation capsule inhale the contents of one capsule in the handihaler once daily 90 capsule 3  . triamterene-hydrochlorothiazide (MAXZIDE) 75-50 MG per tablet take 1 tablet by mouth once daily AFTER BREAKFAST 90  tablet 3  . vardenafil (LEVITRA) 20 MG tablet Take 1 tablet (20 mg total) by mouth daily as needed for erectile dysfunction. 10 tablet 11   No current facility-administered medications for this visit.    Allergies as of 03/03/2015 - Review Complete 03/03/2015  Allergen Reaction Noted  . Codeine  12/08/2006  . Zithromax [azithromycin] Other (See Comments) 07/17/2012    Family History  Problem Relation Age of Onset  . Gallbladder disease Father   . Colon polyps Father   . Prostate cancer Father   . Colon cancer Father   . Heart attack Mother   . Arthritis Mother   . Hypertension Mother   . Arthritis Sister     Social History   Social History  . Marital Status: Married    Spouse Name: N/A  . Number of Children: N/A  . Years of Education: N/A   Occupational History  . 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 Smoking status: Former Smoker    Types: Cigars    Quit date: 05/16/2005  . Smokeless tobacco: Never Used     Comment: 1 small cigar/day x 2 years  . Alcohol Use: No  . Drug Use: No  . Sexual Activity: Not on file   Other Topics Concern  . Not on file   Social History Narrative   Regular Exercise.    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 male with change in bowel habits, resolved with fiber supplement  He started a new fiber supplement and the thinness of his stools seemed to completely resolve.  The trade-off was a bit of gassiness and I recommended that he cut back to one half the usual  dose of this fiber supplement. He will call to report on his response in 3-4 weeks. He has had no bleeding, no weight changes and I do not think he needs endoscopic evaluation at this point.   Owens Loffler, MD [facility] 03/03/2015, 8:46 AM

## 2015-03-03 NOTE — Patient Instructions (Signed)
Cut back your new fiber supplement to 1/2 dose once daily. Call in 3-4 weeks to report on your response.

## 2015-03-11 ENCOUNTER — Encounter: Payer: Self-pay | Admitting: Family Medicine

## 2015-03-11 ENCOUNTER — Other Ambulatory Visit: Payer: Self-pay | Admitting: Family Medicine

## 2015-03-11 MED ORDER — ESCITALOPRAM OXALATE 10 MG PO TABS: 5.0000 mg | ORAL_TABLET | Freq: Every day | ORAL | Status: DC

## 2015-03-16 ENCOUNTER — Encounter: Payer: Self-pay | Admitting: Family Medicine

## 2015-03-22 ENCOUNTER — Encounter: Payer: Self-pay | Admitting: Family Medicine

## 2015-03-23 ENCOUNTER — Encounter: Payer: Self-pay | Admitting: Family Medicine

## 2015-03-24 ENCOUNTER — Encounter: Payer: Self-pay | Admitting: Family Medicine

## 2015-03-24 ENCOUNTER — Other Ambulatory Visit: Payer: Self-pay | Admitting: Family Medicine

## 2015-03-24 MED ORDER — BUPROPION HCL ER (SR) 100 MG PO TB12: 100.0000 mg | ORAL_TABLET | Freq: Two times a day (BID) | ORAL | Status: DC

## 2015-03-27 ENCOUNTER — Encounter: Payer: Self-pay | Admitting: Family Medicine

## 2015-03-27 ENCOUNTER — Other Ambulatory Visit: Payer: Self-pay | Admitting: Family Medicine

## 2015-03-27 MED ORDER — VENLAFAXINE HCL ER 37.5 MG PO CP24: 37.5000 mg | ORAL_CAPSULE | Freq: Every day | ORAL | Status: DC

## 2015-03-31 ENCOUNTER — Encounter: Payer: Self-pay | Admitting: Family Medicine

## 2015-03-31 ENCOUNTER — Ambulatory Visit: Payer: Medicare PPO | Admitting: Emergency Medicine

## 2015-04-01 ENCOUNTER — Other Ambulatory Visit: Payer: Self-pay | Admitting: Family Medicine

## 2015-04-01 MED ORDER — ESCITALOPRAM OXALATE 10 MG PO TABS: 10.0000 mg | ORAL_TABLET | Freq: Every day | ORAL | Status: DC

## 2015-04-10 ENCOUNTER — Encounter: Payer: Self-pay | Admitting: Gastroenterology

## 2015-04-14 ENCOUNTER — Encounter: Payer: Self-pay | Admitting: Emergency Medicine

## 2015-04-14 ENCOUNTER — Ambulatory Visit (INDEPENDENT_AMBULATORY_CARE_PROVIDER_SITE_OTHER): Payer: Medicare PPO | Admitting: Emergency Medicine

## 2015-04-14 VITALS — BP 126/74 | HR 58 | Ht 70.0 in | Wt 182.0 lb

## 2015-04-14 DIAGNOSIS — J309 Allergic rhinitis, unspecified: Secondary | ICD-10-CM | POA: Diagnosis not present

## 2015-04-14 DIAGNOSIS — F32A Depression, unspecified: Secondary | ICD-10-CM

## 2015-04-14 DIAGNOSIS — F329 Major depressive disorder, single episode, unspecified: Secondary | ICD-10-CM

## 2015-04-14 DIAGNOSIS — J449 Chronic obstructive pulmonary disease, unspecified: Secondary | ICD-10-CM

## 2015-04-14 DIAGNOSIS — K219 Gastro-esophageal reflux disease without esophagitis: Secondary | ICD-10-CM

## 2015-04-14 MED ORDER — PREDNISONE 10 MG PO TABS: ORAL_TABLET | ORAL | Status: DC

## 2015-04-14 NOTE — Progress Notes (Signed)
74 yo man with COPD, allergic rhinitis, GERD.   ROV 06/29/09 -- regular f/u for COPD. Has had some URI signs last several days. Having cough, scant sputum. Some nasal congestion. Not really manifesting itself as a change in his breathing, wheeze. Has been using mucinex. Overall breathing has been stable, he doesn't feels that he needs any addition to his maintanance regimen.    PULMONARY FUNCTON TEST 03/11/2009 08/01/2012  FVC 2.03 2.58  FEV1 1.22 1.29  FEV1/FVC 60.1 50  FVC  % Predicted 45.3 61  FEV % Predicted 36.8 45  FeF 25-75  .38  FeF 25-75 % Predicted  2.5    ROV 09/23/14 -- follow up visit for COPD and rhinitis / allergies. He wears his o2 when he does cardio exercise.  He is still able to exert, but probably has a small decrease in functional capacity. He remains on spiriva, symbicort, albuterol prn > uses rarely, although more in the last 2-3 weeks. He is still on zyrtec and fluticasone. He has not had any flares.   ROV 01/06/15 -- follows up for COPD, allergic rhinitis, exertional hypoxemia and chronic hypoxemic risk for failure. At her last visit he was concerned that he is losing some of his functional capacity.  He has started taking his Symbicort before his Spiriva. He doesn't believe that his functional capacity has changed very much. He has been using his O2 with exertion, cardio exercise.  He relates that he has had some sx of depression, anxiety attacks. Has on at least a few occasions of suicidal ideation.    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 At our last visit we talked some about the fact that he's been significantly depressed. I started Lexapro and then this was up-titrated by Dr Damita Dunnings. He tells me that he began to notice some L ear discomfort, seeing ENT for this. Changed Lexapro to Wellbutrin, then to effexor. Ultimately back to lexapro.  He has been using oxygen at night, sounds  more due to anxiety about being able to fall asleep. He believes that he is having more cough, more congestion. Has not had any exacerbations. Occasionally uses albuterol - once a month. His workouts at the gym may be a bit decreased - he is using oxygen with his cardio workouts.                             Filed Vitals:   04/14/15 1122  BP: 126/74  Pulse: 58  Height: 5\' 10"  (1.778 m)  Weight: 182 lb (82.555 kg)  SpO2: 92%    Gen: Pleasant, well-nourished, in no distress,  normal affect  ENT: No lesions,  mouth clear,  oropharynx clear, no postnasal drip  Neck: No JVD, no TMG, no carotid bruits  Lungs: No use of accessory muscles, clear without rales or rhonchi  Cardiovascular: RRR, heart sounds normal, no murmur or gallops, no peripheral edema  Musculoskeletal: No deformities, no cyanosis or clubbing  Neuro: alert, non focal  Skin: Warm, no lesions or rashes  Allergic rhinitis Continue fluticasone and zyrtec.   COPD (chronic obstructive pulmonary disease) We will perform an ONO on RA to establish whether he needs O2 with sleeping. This would hopefully alleviate his anxiety regarding this. He is not using O2 with all exertion- discussed using this more reliably with his workouts.  Will continue Symbicort and Spiriva. Use albuterol as needed.  GERD Continue current therapy  Depression He is following with Dr. Damita Dunnings and with ENT to find any depressive that he can tolerate that does not cause significant side effects. He agrees and I believe his caregivers agree that he does need to be on antidepressant

## 2015-04-14 NOTE — Assessment & Plan Note (Addendum)
We will perform an ONO on RA to establish whether he needs O2 with sleeping. This would hopefully alleviate his anxiety regarding this. He is not using O2 with all exertion- discussed using this more reliably with his workouts.  Will continue Symbicort and Spiriva. Use albuterol as needed.

## 2015-04-14 NOTE — Assessment & Plan Note (Signed)
He is following with Dr. Damita Dunnings and with ENT to find any depressive that he can tolerate that does not cause significant side effects. He agrees and I believe his caregivers agree that he does need to be on antidepressant

## 2015-04-14 NOTE — Assessment & Plan Note (Signed)
Continue fluticasone and zyrtec.

## 2015-04-14 NOTE — Assessment & Plan Note (Signed)
Continue current therapy 

## 2015-04-14 NOTE — Patient Instructions (Signed)
We will arrange for an overnight oximetry on room air.  Please continue Spiriva and Symbicort as you have been taking them Please continue Zyrtec and fluticasone nasal spray Remember to use her oxygen with your workouts. The goal is to keep your oxygen saturations greater than 90% while you are exercising Follow with Dr Lamonte Sakai in 3 months or sooner if you have any problems.

## 2015-04-16 ENCOUNTER — Encounter: Payer: Self-pay | Admitting: Family Medicine

## 2015-04-17 ENCOUNTER — Encounter: Payer: Self-pay | Admitting: Family Medicine

## 2015-04-17 ENCOUNTER — Ambulatory Visit (INDEPENDENT_AMBULATORY_CARE_PROVIDER_SITE_OTHER): Payer: Medicare PPO | Admitting: Family Medicine

## 2015-04-17 VITALS — BP 142/76 | HR 53 | Temp 98.4°F | Wt 183.5 lb

## 2015-04-17 DIAGNOSIS — J309 Allergic rhinitis, unspecified: Secondary | ICD-10-CM

## 2015-04-17 DIAGNOSIS — I10 Essential (primary) hypertension: Secondary | ICD-10-CM

## 2015-04-17 DIAGNOSIS — M109 Gout, unspecified: Secondary | ICD-10-CM

## 2015-04-17 DIAGNOSIS — N400 Enlarged prostate without lower urinary tract symptoms: Secondary | ICD-10-CM

## 2015-04-17 DIAGNOSIS — R0602 Shortness of breath: Secondary | ICD-10-CM

## 2015-04-17 DIAGNOSIS — N529 Male erectile dysfunction, unspecified: Secondary | ICD-10-CM

## 2015-04-17 DIAGNOSIS — K219 Gastro-esophageal reflux disease without esophagitis: Secondary | ICD-10-CM

## 2015-04-17 DIAGNOSIS — M1009 Idiopathic gout, multiple sites: Secondary | ICD-10-CM

## 2015-04-17 DIAGNOSIS — J449 Chronic obstructive pulmonary disease, unspecified: Secondary | ICD-10-CM

## 2015-04-17 DIAGNOSIS — E785 Hyperlipidemia, unspecified: Secondary | ICD-10-CM

## 2015-04-17 DIAGNOSIS — J45909 Unspecified asthma, uncomplicated: Secondary | ICD-10-CM

## 2015-04-17 DIAGNOSIS — IMO0002 Reserved for concepts with insufficient information to code with codable children: Secondary | ICD-10-CM

## 2015-04-17 DIAGNOSIS — R001 Bradycardia, unspecified: Secondary | ICD-10-CM

## 2015-04-17 DIAGNOSIS — M1710 Unilateral primary osteoarthritis, unspecified knee: Secondary | ICD-10-CM

## 2015-04-17 DIAGNOSIS — M545 Low back pain: Secondary | ICD-10-CM

## 2015-04-17 DIAGNOSIS — J441 Chronic obstructive pulmonary disease with (acute) exacerbation: Secondary | ICD-10-CM

## 2015-04-17 DIAGNOSIS — M179 Osteoarthritis of knee, unspecified: Secondary | ICD-10-CM

## 2015-04-17 MED ORDER — PREDNISONE 10 MG PO TABS: ORAL_TABLET | ORAL | Status: DC

## 2015-04-17 MED ORDER — ATENOLOL 50 MG PO TABS: 25.0000 mg | ORAL_TABLET | Freq: Every day | ORAL | Status: DC

## 2015-04-17 MED ORDER — LOSARTAN POTASSIUM 25 MG PO TABS: 12.5000 mg | ORAL_TABLET | ORAL | Status: DC

## 2015-04-17 NOTE — Patient Instructions (Addendum)
Skip the atenolol tomorrow and then restart 1/2 tab a day.  Your EKG was fine today.  Take care.  Glad to see you.  Update me Monday.

## 2015-04-17 NOTE — Progress Notes (Signed)
Pre visit review using our clinic review tool, if applicable. No additional management support is needed unless otherwise documented below in the visit note.  Had been lightheaded with bradycardia noted.  Qtc wnl today on ekg.  No sig change in EKG, d/w pt.  Still on BB.  He can't get HR above 90 with exercise on 50mg  of atenolol.    He still has ear sx but mood is better on lexapro.    Meds, vitals, and allergies reviewed.   ROS: See HPI.  Otherwise, noncontributory.  Not on O2.  Pulse ox 96% RA nad ncat Mmm Rrr, mild brady noted ctab abd soft Ext w/o edema.

## 2015-04-19 DIAGNOSIS — R001 Bradycardia, unspecified: Secondary | ICD-10-CM | POA: Insufficient documentation

## 2015-04-19 NOTE — Assessment & Plan Note (Signed)
Had been lightheaded with bradycardia noted. Qtc wnl today on ekg. No sig change in EKG, d/w pt. Still on BB.  Hadn't been needing TCA for sleep recently so that isn't contributing.  Likely overtreatment with BB, will dec dose of 25mg  atenolol qd after skipping one day.  He'll update me next week.   Okay for outpatient f/u.  SSRI likely not contributing to bradycardia.  Continue lexapro for now.  He agrees.  >25 minutes spent in face to face time with patient, >50% spent in counselling or coordination of care.

## 2015-04-20 ENCOUNTER — Encounter: Payer: Self-pay | Admitting: Family Medicine

## 2015-04-21 ENCOUNTER — Encounter: Payer: Self-pay | Admitting: Family Medicine

## 2015-04-28 ENCOUNTER — Other Ambulatory Visit: Payer: Self-pay | Admitting: Family Medicine

## 2015-04-28 ENCOUNTER — Encounter: Payer: Self-pay | Admitting: Family Medicine

## 2015-04-28 MED ORDER — VALACYCLOVIR HCL 1 G PO TABS: 2000.0000 mg | ORAL_TABLET | Freq: Two times a day (BID) | ORAL | Status: DC

## 2015-05-06 ENCOUNTER — Telehealth: Payer: Self-pay | Admitting: Emergency Medicine

## 2015-05-06 NOTE — Telephone Encounter (Signed)
Per RB >> Please let pt know that his oxygen level was good, but they only measured 2 hours. May be under estimating due to short duration of test.  lmtcb x1 for pt.

## 2015-05-07 ENCOUNTER — Encounter: Payer: Self-pay | Admitting: Family Medicine

## 2015-05-07 NOTE — Telephone Encounter (Signed)
Patient notified of ONO results. Patient says that he could not sleep with the ONO on his finger. Pt says that when he woke up, the machine read that his O2 was 86%, so he said he is surprised that the test was good. He says he doesn't want to take the test again.   He says that he sleeps well on the oxygen, it helps him to sleep, so he would like to keep the oxygen at night. Wants to know what RB recommends he does at this point. Recommends that we call [phone], or contact him through [facility].    RB - please advise.

## 2015-05-07 NOTE — Telephone Encounter (Signed)
lmtcb x2 on pt's home and cell numbers.

## 2015-05-12 NOTE — Telephone Encounter (Signed)
Patient Returned call [phone]

## 2015-05-12 NOTE — Telephone Encounter (Signed)
Called spoke w/ pt. Made aware we are awaiting RB recs.

## 2015-05-18 NOTE — Telephone Encounter (Signed)
His test showed short sleep time / measurement time. Although there was little desaturation noted I am not sure this was a reliable test. He can continue to use same O2 at night.

## 2015-05-19 NOTE — Telephone Encounter (Signed)
[phone], pt cb

## 2015-05-19 NOTE — Telephone Encounter (Signed)
lmtcb x1 for pt. 

## 2015-05-19 NOTE — Telephone Encounter (Signed)
Pt aware of results and rec's per RB. Nothing further needed.

## 2015-05-20 ENCOUNTER — Encounter: Payer: Self-pay | Admitting: Emergency Medicine

## 2015-05-25 ENCOUNTER — Telehealth: Payer: Self-pay | Admitting: Gastroenterology

## 2015-05-25 DIAGNOSIS — R194 Change in bowel habit: Secondary | ICD-10-CM

## 2015-05-25 DIAGNOSIS — Z789 Other specified health status: Secondary | ICD-10-CM

## 2015-05-26 ENCOUNTER — Encounter: Payer: Self-pay | Admitting: Gastroenterology

## 2015-05-26 NOTE — Telephone Encounter (Signed)
Dr Ardis Hughs is this ok to go ahead and schedule directly?

## 2015-05-26 NOTE — Telephone Encounter (Signed)
Yes and yes.  PICC in early AM, then colonoscopy later that day at [facility].  thanks

## 2015-05-26 NOTE — Telephone Encounter (Signed)
That is OK with me

## 2015-05-26 NOTE — Telephone Encounter (Signed)
Dr Ardis Hughs the pt states he needs a PICC line prior to colon, do you want him to have the PICC and also, should he be done at [facility]

## 2015-05-27 ENCOUNTER — Encounter: Payer: Self-pay | Admitting: Gastroenterology

## 2015-05-27 NOTE — Telephone Encounter (Signed)
The pt sent a message that he has a new insurance card and will bring it in on Monday.

## 2015-05-28 ENCOUNTER — Ambulatory Visit: Payer: Medicare PPO | Admitting: Emergency Medicine

## 2015-06-04 NOTE — Telephone Encounter (Signed)
Pt called to set up colon and then an order for IR cv line needs to be placed with date of colon.  Left message on machine to call back

## 2015-06-05 ENCOUNTER — Other Ambulatory Visit: Payer: Self-pay

## 2015-06-05 DIAGNOSIS — R194 Change in bowel habit: Secondary | ICD-10-CM

## 2015-06-05 MED ORDER — NA SULFATE-K SULFATE-MG SULF 17.5-3.13-1.6 GM/177ML PO SOLN: 1.0000 | Freq: Once | ORAL | Status: DC

## 2015-06-05 NOTE — Telephone Encounter (Signed)
PICC line scheduled via IR and colon has been scheduled.  Prep sent and Instructions in My Chart and available for the pt review.  Pt has been notified.

## 2015-06-09 ENCOUNTER — Other Ambulatory Visit: Payer: Self-pay | Admitting: Family Medicine

## 2015-06-09 DIAGNOSIS — I10 Essential (primary) hypertension: Secondary | ICD-10-CM

## 2015-06-10 MED ORDER — TRIAMTERENE-HCTZ 75-50 MG PO TABS: ORAL_TABLET | ORAL | Status: DC

## 2015-06-10 NOTE — Telephone Encounter (Signed)
Sent. Thanks.   

## 2015-06-23 ENCOUNTER — Ambulatory Visit: Payer: Medicare PPO | Admitting: Family Medicine

## 2015-07-07 ENCOUNTER — Encounter (HOSPITAL_COMMUNITY): Payer: Self-pay | Admitting: *Deleted

## 2015-07-14 ENCOUNTER — Ambulatory Visit (INDEPENDENT_AMBULATORY_CARE_PROVIDER_SITE_OTHER): Payer: Medicare Other | Admitting: Emergency Medicine

## 2015-07-14 ENCOUNTER — Encounter: Payer: Self-pay | Admitting: Emergency Medicine

## 2015-07-14 VITALS — BP 110/66 | HR 62 | Ht 70.0 in | Wt 168.0 lb

## 2015-07-14 DIAGNOSIS — J309 Allergic rhinitis, unspecified: Secondary | ICD-10-CM

## 2015-07-14 DIAGNOSIS — J449 Chronic obstructive pulmonary disease, unspecified: Secondary | ICD-10-CM

## 2015-07-14 NOTE — Patient Instructions (Addendum)
Please continue your Symbicort and Spiriva  Keep albuterol available to use as needed.  Please continue fluticasone nasal spray and  Zyrtec.  Wear your oxygen reliably with exertion.  Establish with a Pulmonologist in [place] when you make your permanent move.  Follow with Dr Lamonte Sakai in 4 months if you are still living locally

## 2015-07-14 NOTE — Assessment & Plan Note (Signed)
Please continue fluticasone nasal spray and  Zyrtec.

## 2015-07-14 NOTE — Progress Notes (Signed)
75 yo man with COPD, allergic rhinitis, GERD.   ROV 06/29/09 -- regular f/u for COPD. Has had some URI signs last several days. Having cough, scant sputum. Some nasal congestion. Not really manifesting itself as a change in his breathing, wheeze. Has been using mucinex. Overall breathing has been stable, he doesn't feels that he needs any addition to his maintanance regimen.    PULMONARY FUNCTON TEST 03/11/2009 08/01/2012  FVC 2.03 2.58  FEV1 1.22 1.29  FEV1/FVC 60.1 50  FVC  % Predicted 45.3 61  FEV % Predicted 36.8 45  FeF 25-75  .38  FeF 25-75 % Predicted  2.5    ROV 09/23/14 -- follow up visit for COPD and rhinitis / allergies. He wears his o2 when he does cardio exercise.  He is still able to exert, but probably has a small decrease in functional capacity. He remains on spiriva, symbicort, albuterol prn > uses rarely, although more in the last 2-3 weeks. He is still on zyrtec and fluticasone. He has not had any flares.   ROV 01/06/15 -- follows up for COPD, allergic rhinitis, exertional hypoxemia and chronic hypoxemic risk for failure. At her last visit he was concerned that he is losing some of his functional capacity.  He has started taking his Symbicort before his Spiriva. He doesn't believe that his functional capacity has changed very much. He has been using his O2 with exertion, cardio exercise.  He relates that he has had some sx of depression, anxiety attacks. Has on at least a few occasions of suicidal ideation.    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 At our last visit we talked some about the fact that he's been significantly depressed. I started Lexapro and then this was up-titrated by Dr Damita Dunnings. He tells me that he began to notice some L ear discomfort, seeing ENT for this. Changed Lexapro to Wellbutrin, then to effexor. Ultimately back to lexapro.  He has been using oxygen at night, sounds  more due to anxiety about being able to fall asleep. He believes that he is having more cough, more congestion. Has not had any exacerbations. Occasionally uses albuterol - once a month. His workouts at the gym may be a bit decreased - he is using oxygen with his cardio workouts.   ROV 07/14/15 -- follow-up visit for COPD, exertional desaturation and hypoxemia, allergic rhinitis. He has not been using O2 at night, does use it with exertion. He is still active, is going to the gym (w his O2). He has been working to sell his home - has lost weight with all the work.  He is on Symbicort + Spiriva. He has occasionally used albuterol - rare. No cough, no wheeze no CP.                             Filed Vitals:   07/14/15 0944  BP: 110/66  Pulse: 62  Height: 5\' 10"  (1.778 m)  Weight: 76.204 kg (168 lb)  SpO2: 95%    Gen: Pleasant, well-nourished, in no distress,  normal affect  ENT: No lesions,  mouth clear,  oropharynx clear, no postnasal drip  Neck: No JVD, no TMG, no carotid bruits  Lungs: No use of accessory muscles, clear without rales or rhonchi  Cardiovascular: RRR, heart sounds normal, no murmur or gallops, no peripheral edema  Musculoskeletal: No deformities, no cyanosis or clubbing  Neuro: alert, non focal  Skin: Warm, no lesions or rashes  COPD (chronic obstructive pulmonary disease) Please continue your Symbicort and Spiriva  Keep albuterol available to use as needed.  Wear your oxygen reliably with exertion.  Establish with a Pulmonologist in [place] when you make your permanent move.  Follow with Dr Lamonte Sakai in 4 months if you are still living locally  Allergic rhinitis Please continue fluticasone nasal spray and  Zyrtec.     Baltazar Apo, MD, PhD 07/14/2015, 9:58 AM  [facility] [phone] or if no answer [phone]

## 2015-07-14 NOTE — Assessment & Plan Note (Signed)
Please continue your Symbicort and Spiriva  Keep albuterol available to use as needed.  Wear your oxygen reliably with exertion.  Establish with a Pulmonologist in [place] when you make your permanent move.  Follow with Dr Lamonte Sakai in 4 months if you are still living locally

## 2015-07-15 ENCOUNTER — Other Ambulatory Visit: Payer: Self-pay | Admitting: Gastroenterology

## 2015-07-15 ENCOUNTER — Ambulatory Visit (HOSPITAL_COMMUNITY)
Admission: RE | Admit: 2015-07-15 | Discharge: 2015-07-15 | Disposition: A | Discharge status: Home / Self Care | Payer: Medicare Other | Source: Ambulatory Visit | Attending: Gastroenterology | Admitting: Gastroenterology

## 2015-07-15 DIAGNOSIS — R194 Change in bowel habit: Secondary | ICD-10-CM

## 2015-07-15 DIAGNOSIS — Z789 Other specified health status: Secondary | ICD-10-CM

## 2015-07-15 DIAGNOSIS — I878 Other specified disorders of veins: Secondary | ICD-10-CM | POA: Insufficient documentation

## 2015-07-15 MED ORDER — LIDOCAINE HCL 1 % IJ SOLN
INTRAMUSCULAR | Status: AC
  Filled 2015-07-15: qty 20

## 2015-07-15 MED ORDER — HEPARIN SOD (PORK) LOCK FLUSH 100 UNIT/ML IV SOLN
INTRAVENOUS | Status: AC
  Filled 2015-07-15: qty 5

## 2015-07-15 NOTE — Discharge Instructions (Signed)
PICC Home Guide A peripherally inserted central catheter (PICC) is a long, thin, flexible tube that is inserted into a vein in the upper arm. It is a form of intravenous (IV) access. It is considered to be a "central" line because the tip of the PICC ends in a large vein in your chest. This large vein is called the superior vena cava (SVC). The PICC tip ends in the SVC because there is a lot of blood flow in the SVC. This allows medicines and IV fluids to be quickly distributed throughout the body. The PICC is inserted using a sterile technique by a specially trained nurse or physician. After the PICC is inserted, a chest X-ray exam is done to be sure it is in the correct place.  A PICC may be placed for different reasons, such as:  To give medicines and liquid nutrition that can only be given through a central line. Examples are:  Certain antibiotic treatments.  Chemotherapy.  Total parenteral nutrition (TPN).  To take frequent blood samples.  To give IV fluids and blood products.  If there is difficulty placing a peripheral intravenous (PIV) catheter. If taken care of properly, a PICC can remain in place for several months. A PICC can also allow a person to go home from the hospital early. Medicine and PICC care can be managed at home by a family member or home health care team. WHAT PROBLEMS CAN HAPPEN WHEN I HAVE A PICC? Problems with a PICC can occasionally occur. These may include the following:  A blood clot (thrombus) forming in or at the tip of the PICC. This can cause the PICC to become clogged. A clot-dissolving medicine called tissue plasminogen activator (tPA) can be given through the PICC to help break up the clot.  Inflammation of the vein (phlebitis) in which the PICC is placed. Signs of inflammation may include redness, pain at the insertion site, red streaks, or being able to feel a "cord" in the vein where the PICC is located.  Infection in the PICC or at the insertion  site. Signs of infection may include fever, chills, redness, swelling, or pus drainage from the PICC insertion site.  PICC movement (malposition). The PICC tip may move from its original position due to excessive physical activity, forceful coughing, sneezing, or vomiting.  A break or cut in the PICC. It is important to not use scissors near the PICC.  Nerve or tendon irritation or injury during PICC insertion. WHAT SHOULD I KEEP IN MIND ABOUT ACTIVITIES WHEN I HAVE A PICC?  You may bend your arm and move it freely. If your PICC is near or at the bend of your elbow, avoid activity with repeated motion at the elbow.  Rest at home for the remainder of the day following PICC line insertion.  Avoid lifting heavy objects as instructed by your health care provider.  Avoid using a crutch with the arm on the same side as your PICC. You may need to use a walker. WHAT SHOULD I KNOW ABOUT MY PICC DRESSING?  Keep your PICC bandage (dressing) clean and dry to prevent infection.  Ask your health care provider when you may shower. Ask your health care provider to teach you how to wrap the PICC when you do take a shower.  Change the PICC dressing as instructed by your health care provider.  Change your PICC dressing if it becomes loose or wet. WHAT SHOULD I KNOW ABOUT PICC CARE?  Check the PICC insertion site   daily for leakage, redness, swelling, or pain.  Do not take a bath, swim, or use hot tubs when you have a PICC. Cover PICC line with clear plastic wrap and tape to keep it dry while showering.  Flush the PICC as directed by your health care provider. Let your health care provider know right away if the PICC is difficult to flush or does not flush. Do not use force to flush the PICC.  Do not use a syringe that is less than 10 mL to flush the PICC.  Never pull or tug on the PICC.  Avoid blood pressure checks on the arm with the PICC.  Keep your PICC identification card with you at all  times.  Do not take the PICC out yourself. Only a trained clinical professional should remove the PICC. SEEK IMMEDIATE MEDICAL CARE IF:  Your PICC is accidentally pulled all the way out. If this happens, cover the insertion site with a bandage or gauze dressing. Do not throw the PICC away. Your health care provider will need to inspect it.  Your PICC was tugged or pulled and has partially come out. Do not  push the PICC back in.  There is any type of drainage, redness, or swelling where the PICC enters the skin.  You cannot flush the PICC, it is difficult to flush, or the PICC leaks around the insertion site when it is flushed.  You hear a "flushing" sound when the PICC is flushed.  You have pain, discomfort, or numbness in your arm, shoulder, or jaw on the same side as the PICC.  You feel your heart "racing" or skipping beats.  You notice a hole or tear in the PICC.  You develop chills or a fever. MAKE SURE YOU:   Understand these instructions.  Will watch your condition.  Will get help right away if you are not doing well or get worse.   This information is not intended to replace advice given to you by your health care provider. Make sure you discuss any questions you have with your health care provider.   Document Released: 11/06/2002 Document Revised: 05/23/2014 Document Reviewed: 01/07/2013 Elsevier Interactive Patient Education 2016 [employer]  

## 2015-07-15 NOTE — Procedures (Signed)
R arm PowerPICC placed under US and fluoroscopy No ptx on spot chest radiograph. No complication No blood loss. See complete dictation in [facility] PACS.  

## 2015-07-16 ENCOUNTER — Ambulatory Visit (HOSPITAL_COMMUNITY): Payer: Medicare Other | Admitting: Anesthesiology

## 2015-07-16 ENCOUNTER — Encounter (HOSPITAL_COMMUNITY)
Admission: RE | Disposition: A | Discharge status: Home / Self Care | Payer: Self-pay | Source: Ambulatory Visit | Attending: Gastroenterology

## 2015-07-16 ENCOUNTER — Encounter (HOSPITAL_COMMUNITY): Payer: Self-pay

## 2015-07-16 ENCOUNTER — Ambulatory Visit (HOSPITAL_COMMUNITY)
Admission: RE | Admit: 2015-07-16 | Discharge: 2015-07-16 | Disposition: A | Discharge status: Home / Self Care | Payer: Medicare Other | Source: Ambulatory Visit | Attending: Gastroenterology | Admitting: Gastroenterology

## 2015-07-16 DIAGNOSIS — Z86010 Personal history of colonic polyps: Secondary | ICD-10-CM | POA: Diagnosis not present

## 2015-07-16 DIAGNOSIS — D124 Benign neoplasm of descending colon: Secondary | ICD-10-CM | POA: Diagnosis not present

## 2015-07-16 DIAGNOSIS — M1990 Unspecified osteoarthritis, unspecified site: Secondary | ICD-10-CM | POA: Insufficient documentation

## 2015-07-16 DIAGNOSIS — Z9981 Dependence on supplemental oxygen: Secondary | ICD-10-CM | POA: Insufficient documentation

## 2015-07-16 DIAGNOSIS — R194 Change in bowel habit: Secondary | ICD-10-CM

## 2015-07-16 DIAGNOSIS — K5730 Diverticulosis of large intestine without perforation or abscess without bleeding: Secondary | ICD-10-CM | POA: Diagnosis not present

## 2015-07-16 DIAGNOSIS — Z79899 Other long term (current) drug therapy: Secondary | ICD-10-CM | POA: Diagnosis not present

## 2015-07-16 DIAGNOSIS — Z1211 Encounter for screening for malignant neoplasm of colon: Secondary | ICD-10-CM | POA: Diagnosis not present

## 2015-07-16 DIAGNOSIS — J45909 Unspecified asthma, uncomplicated: Secondary | ICD-10-CM | POA: Insufficient documentation

## 2015-07-16 DIAGNOSIS — J449 Chronic obstructive pulmonary disease, unspecified: Secondary | ICD-10-CM | POA: Diagnosis not present

## 2015-07-16 DIAGNOSIS — I10 Essential (primary) hypertension: Secondary | ICD-10-CM | POA: Insufficient documentation

## 2015-07-16 DIAGNOSIS — Z87891 Personal history of nicotine dependence: Secondary | ICD-10-CM | POA: Insufficient documentation

## 2015-07-16 DIAGNOSIS — K219 Gastro-esophageal reflux disease without esophagitis: Secondary | ICD-10-CM | POA: Diagnosis not present

## 2015-07-16 HISTORY — PX: COLONOSCOPY WITH PROPOFOL: SHX5780

## 2015-07-16 HISTORY — DX: Gout, unspecified: M10.9

## 2015-07-16 HISTORY — DX: Dependence on supplemental oxygen: Z99.81

## 2015-07-16 SURGERY — COLONOSCOPY WITH PROPOFOL
Anesthesia: Monitor Anesthesia Care

## 2015-07-16 MED ORDER — PROPOFOL 10 MG/ML IV BOLUS
INTRAVENOUS | Status: AC
  Filled 2015-07-16: qty 40

## 2015-07-16 MED ORDER — PROPOFOL 10 MG/ML IV BOLUS
INTRAVENOUS | Status: DC | PRN
  Administered 2015-07-16 (×6): 20 mg via INTRAVENOUS
  Administered 2015-07-16: 10 mg via INTRAVENOUS
  Administered 2015-07-16 (×5): 20 mg via INTRAVENOUS

## 2015-07-16 MED ORDER — LACTATED RINGERS IV SOLN
INTRAVENOUS | Status: DC
  Administered 2015-07-16: 1000 mL via INTRAVENOUS

## 2015-07-16 MED ORDER — SODIUM CHLORIDE 0.9 % IV SOLN: INTRAVENOUS | Status: DC

## 2015-07-16 SURGICAL SUPPLY — 21 items

## 2015-07-16 NOTE — Op Note (Signed)
[facility] [place], [id number]   COLONOSCOPY PROCEDURE REPORT  PATIENT: [name], [name]  MR#: [id number] BIRTHDATE: 1941-03-19 , 74  yrs. old GENDER: male ENDOSCOPIST: Milus Banister, MD PROCEDURE DATE:  07/16/2015 PROCEDURE:   Colonoscopy, surveillance and Colonoscopy with snare polypectomy First Screening Colonoscopy - Avg.  risk and is 50 yrs.  old or older - No.  Prior Negative Screening - Now for repeat screening. N/A  History of Adenoma - Now for follow-up colonoscopy & has been > or = to 3 yrs.  Yes hx of adenoma.  Has been 3 or more years since last colonoscopy.  Polyps removed today? Yes ASA CLASS:   Class II INDICATIONS:Surveillance due to prior colonic neoplasia and Personal history of tubular adenomas.  Colonoscopy 3/07 with two small TAs, next recall 07/2010.  09/2010 colonoscopy, Ardis Hughs, TICs, diminutive polyp that was not adenomatous.  Recommended recall at 5 years.Marland Kitchen MEDICATIONS: Monitored anesthesia care  DESCRIPTION OF PROCEDURE:   After the risks benefits and alternatives of the procedure were thoroughly explained, informed consent was obtained.  The digital rectal exam revealed no abnormalities of the rectum.   The [id number]  endoscope was introduced through the anus and advanced to the cecum, which was identified by both the appendix and ileocecal valve. No adverse events experienced.   The quality of the prep was excellent.  The instrument was then slowly withdrawn as the colon was fully examined. Estimated blood loss is zero unless otherwise noted in this procedure report.  COLON FINDINGS: A sessile polyp measuring 5 mm in size was found in the descending colon.  A polypectomy was performed with a cold snare.  The resection was complete, the polyp tissue was completely retrieved and sent to histology.   There was mild diverticulosis noted in the left colon.   The examination was otherwise normal. Retroflexed  views revealed no abnormalities. The time to cecum = 5.5 Withdrawal time = 9.6   The scope was withdrawn and the procedure completed. COMPLICATIONS: There were no immediate complications.  ENDOSCOPIC IMPRESSION: 1.   Sessile polyp was found in the descending colon; polypectomy was performed with a cold snare 2.   Mild diverticulosis was noted in the left colon 3.   The examination was otherwise normal  RECOMMENDATIONS: If the polyp(s) removed today are proven to be adenomatous (pre-cancerous) polyps, you will need a repeat colonoscopy in 5 years.  You will receive a letter within 1-2 weeks with the results of your biopsy as well as final recommendations.  Please call my office if you have not received a letter after 3 weeks.  eSigned:  Milus Banister, MD 07/16/2015 7:38 AM   cc: Renford Dills, MD

## 2015-07-16 NOTE — Anesthesia Postprocedure Evaluation (Deleted)
Anesthesia Post Note  Patient: [name]  Procedure(s) Performed: Procedure(s) (LRB): COLONOSCOPY WITH PROPOFOL (N/A)  Patient location during evaluation: PACU Anesthesia Type: General Level of consciousness: awake and alert Pain management: pain level controlled Vital Signs Assessment: post-procedure vital signs reviewed and stable Respiratory status: spontaneous breathing, nonlabored ventilation, respiratory function stable and patient connected to nasal cannula oxygen Cardiovascular status: blood pressure returned to baseline and stable Postop Assessment: no signs of nausea or vomiting Anesthetic complications: no    Last Vitals:  Filed Vitals:   07/16/15 0810 07/16/15 0820  BP: 119/58 119/72  Pulse: 56 54  Temp:    Resp: 23 15    Last Pain: There were no vitals filed for this visit.               Catalina Gravel

## 2015-07-16 NOTE — Discharge Instructions (Signed)

## 2015-07-16 NOTE — Transfer of Care (Signed)
Immediate Anesthesia Transfer of Care Note  Patient: [name]  Procedure(s) Performed: Procedure(s): COLONOSCOPY WITH PROPOFOL (N/A)  Patient Location: PACU  Anesthesia Type:MAC  Level of Consciousness: sedated  Airway & Oxygen Therapy: Patient Spontanous Breathing and Patient connected to nasal cannula oxygen  Post-op Assessment: Report given to RN and Post -op Vital signs reviewed and stable  Post vital signs: Reviewed and stable  Last Vitals:  Filed Vitals:   07/16/15 0656  BP: 136/82  Pulse: 64  Temp: 36.7 C  Resp: 18    Complications: No apparent anesthesia complications

## 2015-07-16 NOTE — Anesthesia Preprocedure Evaluation (Addendum)
Anesthesia Evaluation  Patient identified by MRN, date of birth, ID band Patient awake    Reviewed: Allergy & Precautions, NPO status , Patient's Chart, lab work & pertinent test results, reviewed documented beta blocker date and time   Airway Mallampati: II  TM Distance: >3 FB Neck ROM: Full    Dental  (+) Dental Advisory Given, Lower Dentures, Upper Dentures   Pulmonary asthma , COPD,  COPD inhaler and oxygen dependent, former smoker,    Pulmonary exam normal breath sounds clear to auscultation       Cardiovascular hypertension, Pt. on medications and Pt. on home beta blockers Normal cardiovascular exam Rhythm:Regular Rate:Normal     Neuro/Psych negative neurological ROS     GI/Hepatic Neg liver ROS, GERD  Medicated,  Endo/Other  negative endocrine ROS  Renal/GU negative Renal ROS     Musculoskeletal  (+) Arthritis , Osteoarthritis,    Abdominal   Peds  Hematology negative hematology ROS (+)   Anesthesia Other Findings Day of surgery medications reviewed with the patient.  Reproductive/Obstetrics                            Anesthesia Physical Anesthesia Plan  ASA: III  Anesthesia Plan: MAC   Post-op Pain Management:    Induction: Intravenous  Airway Management Planned: Nasal Cannula  Additional Equipment:   Intra-op Plan:   Post-operative Plan:   Informed Consent: I have reviewed the patients History and Physical, chart, labs and discussed the procedure including the risks, benefits and alternatives for the proposed anesthesia with the patient or authorized representative who has indicated his/her understanding and acceptance.   Dental advisory given  Plan Discussed with: CRNA and Anesthesiologist  Anesthesia Plan Comments: (Discussed risks/benefits/alternatives to MAC sedation including need for ventilatory support, hypotension, need for conversion to general anesthesia.   All patient questions answered.  Patient wished to proceed.)        Anesthesia Quick Evaluation

## 2015-07-16 NOTE — Anesthesia Postprocedure Evaluation (Signed)
Anesthesia Post Note  Patient: [name]  Procedure(s) Performed: Procedure(s) (LRB): COLONOSCOPY WITH PROPOFOL (N/A)  Patient location during evaluation: PACU Anesthesia Type: MAC Level of consciousness: awake and alert Pain management: pain level controlled Vital Signs Assessment: post-procedure vital signs reviewed and stable Respiratory status: spontaneous breathing, nonlabored ventilation, respiratory function stable and patient connected to nasal cannula oxygen Cardiovascular status: stable and blood pressure returned to baseline Anesthetic complications: no    Last Vitals:  Filed Vitals:   07/16/15 0810 07/16/15 0820  BP: 119/58 119/72  Pulse: 56 54  Temp:    Resp: 23 15    Last Pain: There were no vitals filed for this visit.               Catalina Gravel

## 2015-07-16 NOTE — H&P (View-Only) (Signed)
75 yo man with COPD, allergic rhinitis, GERD.   ROV 06/29/09 -- regular f/u for COPD. Has had some URI signs last several days. Having cough, scant sputum. Some nasal congestion. Not really manifesting itself as a change in his breathing, wheeze. Has been using mucinex. Overall breathing has been stable, he doesn't feels that he needs any addition to his maintanance regimen.    PULMONARY FUNCTON TEST 03/11/2009 08/01/2012  FVC 2.03 2.58  FEV1 1.22 1.29  FEV1/FVC 60.1 50  FVC  % Predicted 45.3 61  FEV % Predicted 36.8 45  FeF 25-75  .38  FeF 25-75 % Predicted  2.5    ROV 09/23/14 -- follow up visit for COPD and rhinitis / allergies. He wears his o2 when he does cardio exercise.  He is still able to exert, but probably has a small decrease in functional capacity. He remains on spiriva, symbicort, albuterol prn > uses rarely, although more in the last 2-3 weeks. He is still on zyrtec and fluticasone. He has not had any flares.   ROV 01/06/15 -- follows up for COPD, allergic rhinitis, exertional hypoxemia and chronic hypoxemic risk for failure. At her last visit he was concerned that he is losing some of his functional capacity.  He has started taking his Symbicort before his Spiriva. He doesn't believe that his functional capacity has changed very much. He has been using his O2 with exertion, cardio exercise.  He relates that he has had some sx of depression, anxiety attacks. Has on at least a few occasions of suicidal ideation.    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 At our last visit we talked some about the fact that he's been significantly depressed. I started Lexapro and then this was up-titrated by Dr Damita Dunnings. He tells me that he began to notice some L ear discomfort, seeing ENT for this. Changed Lexapro to Wellbutrin, then to effexor. Ultimately back to lexapro.  He has been using oxygen at night, sounds  more due to anxiety about being able to fall asleep. He believes that he is having more cough, more congestion. Has not had any exacerbations. Occasionally uses albuterol - once a month. His workouts at the gym may be a bit decreased - he is using oxygen with his cardio workouts.   ROV 07/14/15 -- follow-up visit for COPD, exertional desaturation and hypoxemia, allergic rhinitis. He has not been using O2 at night, does use it with exertion. He is still active, is going to the gym (w his O2). He has been working to sell his home - has lost weight with all the work.  He is on Symbicort + Spiriva. He has occasionally used albuterol - rare. No cough, no wheeze no CP.                             Filed Vitals:   07/14/15 0944  BP: 110/66  Pulse: 62  Height: 5\' 10"  (1.778 m)  Weight: 76.204 kg (168 lb)  SpO2: 95%    Gen: Pleasant, well-nourished, in no distress,  normal affect  ENT: No lesions,  mouth clear,  oropharynx clear, no postnasal drip  Neck: No JVD, no TMG, no carotid bruits  Lungs: No use of accessory muscles, clear without rales or rhonchi  Cardiovascular: RRR, heart sounds normal, no murmur or gallops, no peripheral edema  Musculoskeletal: No deformities, no cyanosis or clubbing  Neuro: alert, non focal  Skin: Warm, no lesions or rashes  COPD (chronic obstructive pulmonary disease) Please continue your Symbicort and Spiriva  Keep albuterol available to use as needed.  Wear your oxygen reliably with exertion.  Establish with a Pulmonologist in [place] when you make your permanent move.  Follow with Dr Lamonte Sakai in 4 months if you are still living locally  Allergic rhinitis Please continue fluticasone nasal spray and  Zyrtec.     Baltazar Apo, MD, PhD 07/14/2015, 9:58 AM [facility] [phone] or if no answer [phone]

## 2015-07-16 NOTE — Interval H&P Note (Signed)
History and Physical Interval Note:  07/16/2015 7:06 AM  [name]  has presented today for surgery, with the diagnosis of bowel habit changes  The various methods of treatment have been discussed with the patient and family. After consideration of risks, benefits and other options for treatment, the patient has consented to  Procedure(s): COLONOSCOPY WITH PROPOFOL (N/A) as a surgical intervention .  The patient's history has been reviewed, patient examined, no change in status, stable for surgery.  I have reviewed the patient's chart and labs.  Questions were answered to the patient's satisfaction.     Milus Banister

## 2015-07-20 ENCOUNTER — Encounter (HOSPITAL_COMMUNITY): Payer: Self-pay | Admitting: Gastroenterology

## 2015-07-21 ENCOUNTER — Telehealth: Payer: Self-pay | Admitting: Gastroenterology

## 2015-07-21 NOTE — Telephone Encounter (Signed)
See results note. 

## 2015-07-22 ENCOUNTER — Other Ambulatory Visit: Payer: Self-pay | Admitting: Family Medicine

## 2015-07-22 DIAGNOSIS — Z8739 Personal history of other diseases of the musculoskeletal system and connective tissue: Secondary | ICD-10-CM

## 2015-07-22 DIAGNOSIS — Z125 Encounter for screening for malignant neoplasm of prostate: Secondary | ICD-10-CM

## 2015-07-22 DIAGNOSIS — I10 Essential (primary) hypertension: Secondary | ICD-10-CM

## 2015-07-27 ENCOUNTER — Other Ambulatory Visit (INDEPENDENT_AMBULATORY_CARE_PROVIDER_SITE_OTHER): Payer: Medicare Other

## 2015-07-27 DIAGNOSIS — I10 Essential (primary) hypertension: Secondary | ICD-10-CM

## 2015-07-27 DIAGNOSIS — Z125 Encounter for screening for malignant neoplasm of prostate: Secondary | ICD-10-CM | POA: Diagnosis not present

## 2015-07-27 DIAGNOSIS — Z8639 Personal history of other endocrine, nutritional and metabolic disease: Secondary | ICD-10-CM | POA: Diagnosis not present

## 2015-07-27 DIAGNOSIS — Z8739 Personal history of other diseases of the musculoskeletal system and connective tissue: Secondary | ICD-10-CM

## 2015-07-27 LAB — COMPREHENSIVE METABOLIC PANEL
ALT: 12 U/L (ref 0–53)
AST: 26 U/L (ref 0–37)
Albumin: 3.9 g/dL (ref 3.5–5.2)
Alkaline Phosphatase: 73 U/L (ref 39–117)
BUN: 23 mg/dL (ref 6–23)
CALCIUM: 9 mg/dL (ref 8.4–10.5)
CHLORIDE: 104 meq/L (ref 96–112)
CO2: 24 meq/L (ref 19–32)
Creatinine, Ser: 1.2 mg/dL (ref 0.40–1.50)
GFR: 62.94 mL/min (ref >60.00)
GLUCOSE: 116 mg/dL — AB (ref 70–99)
Potassium: 3.6 mEq/L (ref 3.5–5.1)
Sodium: 137 mEq/L (ref 135–145)
Total Bilirubin: 0.6 mg/dL (ref 0.2–1.2)
Total Protein: 6 g/dL (ref 6.0–8.3)

## 2015-07-27 LAB — LIPID PANEL
CHOL/HDL RATIO: 3
Cholesterol: 151 mg/dL (ref 0–200)
HDL: 50.6 mg/dL (ref >39.00)
LDL CALC: 84 mg/dL (ref 0–99)
NonHDL: 100.09
TRIGLYCERIDES: 82 mg/dL (ref 0.0–149.0)
VLDL: 16.4 mg/dL (ref 0.0–40.0)

## 2015-07-27 LAB — PSA, MEDICARE: PSA: 0.49 ng/ml (ref 0.10–4.00)

## 2015-07-27 LAB — URIC ACID: Uric Acid, Serum: 5.2 mg/dL (ref 4.0–7.8)

## 2015-07-28 ENCOUNTER — Other Ambulatory Visit: Payer: Self-pay | Admitting: Physician Assistant

## 2015-08-03 ENCOUNTER — Ambulatory Visit (INDEPENDENT_AMBULATORY_CARE_PROVIDER_SITE_OTHER): Payer: Medicare Other | Admitting: Family Medicine

## 2015-08-03 ENCOUNTER — Encounter: Payer: Self-pay | Admitting: Family Medicine

## 2015-08-03 VITALS — BP 128/68 | HR 66 | Temp 98.6°F | Ht 70.0 in | Wt 167.5 lb

## 2015-08-03 DIAGNOSIS — K219 Gastro-esophageal reflux disease without esophagitis: Secondary | ICD-10-CM

## 2015-08-03 DIAGNOSIS — I10 Essential (primary) hypertension: Secondary | ICD-10-CM

## 2015-08-03 DIAGNOSIS — Z0000 Encounter for general adult medical examination without abnormal findings: Secondary | ICD-10-CM | POA: Diagnosis not present

## 2015-08-03 DIAGNOSIS — F329 Major depressive disorder, single episode, unspecified: Secondary | ICD-10-CM

## 2015-08-03 DIAGNOSIS — M109 Gout, unspecified: Secondary | ICD-10-CM

## 2015-08-03 DIAGNOSIS — R739 Hyperglycemia, unspecified: Secondary | ICD-10-CM

## 2015-08-03 DIAGNOSIS — M1710 Unilateral primary osteoarthritis, unspecified knee: Secondary | ICD-10-CM

## 2015-08-03 DIAGNOSIS — N400 Enlarged prostate without lower urinary tract symptoms: Secondary | ICD-10-CM

## 2015-08-03 DIAGNOSIS — IMO0002 Reserved for concepts with insufficient information to code with codable children: Secondary | ICD-10-CM

## 2015-08-03 DIAGNOSIS — Z23 Encounter for immunization: Secondary | ICD-10-CM | POA: Diagnosis not present

## 2015-08-03 DIAGNOSIS — J449 Chronic obstructive pulmonary disease, unspecified: Secondary | ICD-10-CM

## 2015-08-03 DIAGNOSIS — J441 Chronic obstructive pulmonary disease with (acute) exacerbation: Secondary | ICD-10-CM

## 2015-08-03 DIAGNOSIS — E785 Hyperlipidemia, unspecified: Secondary | ICD-10-CM

## 2015-08-03 DIAGNOSIS — Z8639 Personal history of other endocrine, nutritional and metabolic disease: Secondary | ICD-10-CM

## 2015-08-03 DIAGNOSIS — J45909 Unspecified asthma, uncomplicated: Secondary | ICD-10-CM

## 2015-08-03 DIAGNOSIS — Z8739 Personal history of other diseases of the musculoskeletal system and connective tissue: Secondary | ICD-10-CM

## 2015-08-03 DIAGNOSIS — N411 Chronic prostatitis: Secondary | ICD-10-CM

## 2015-08-03 DIAGNOSIS — J309 Allergic rhinitis, unspecified: Secondary | ICD-10-CM

## 2015-08-03 DIAGNOSIS — F32A Depression, unspecified: Secondary | ICD-10-CM

## 2015-08-03 DIAGNOSIS — N529 Male erectile dysfunction, unspecified: Secondary | ICD-10-CM

## 2015-08-03 DIAGNOSIS — M545 Low back pain: Secondary | ICD-10-CM

## 2015-08-03 MED ORDER — ATORVASTATIN CALCIUM 40 MG PO TABS: 40.0000 mg | ORAL_TABLET | Freq: Every evening | ORAL | Status: AC

## 2015-08-03 MED ORDER — ATENOLOL 50 MG PO TABS: 25.0000 mg | ORAL_TABLET | Freq: Every day | ORAL | Status: AC

## 2015-08-03 MED ORDER — TIOTROPIUM BROMIDE MONOHYDRATE 18 MCG IN CAPS: ORAL_CAPSULE | RESPIRATORY_TRACT | Status: AC

## 2015-08-03 MED ORDER — SYMBICORT 160-4.5 MCG/ACT IN AERO: INHALATION_SPRAY | RESPIRATORY_TRACT | Status: AC

## 2015-08-03 MED ORDER — PANTOPRAZOLE SODIUM 40 MG PO TBEC: 40.0000 mg | DELAYED_RELEASE_TABLET | Freq: Two times a day (BID) | ORAL | Status: AC

## 2015-08-03 MED ORDER — FLUTICASONE PROPIONATE 50 MCG/ACT NA SUSP: 2.0000 | Freq: Every day | NASAL | Status: AC

## 2015-08-03 MED ORDER — LOSARTAN POTASSIUM 25 MG PO TABS: 25.0000 mg | ORAL_TABLET | Freq: Every day | ORAL | Status: AC

## 2015-08-03 MED ORDER — NABUMETONE 500 MG PO TABS: ORAL_TABLET | ORAL | Status: AC

## 2015-08-03 MED ORDER — ALBUTEROL SULFATE HFA 108 (90 BASE) MCG/ACT IN AERS: 1.0000 | INHALATION_SPRAY | RESPIRATORY_TRACT | Status: AC | PRN

## 2015-08-03 MED ORDER — POTASSIUM CHLORIDE CRYS ER 20 MEQ PO TBCR: 20.0000 meq | EXTENDED_RELEASE_TABLET | Freq: Every day | ORAL | Status: AC

## 2015-08-03 MED ORDER — CYCLOBENZAPRINE HCL 10 MG PO TABS: 10.0000 mg | ORAL_TABLET | Freq: Every day | ORAL | Status: AC

## 2015-08-03 MED ORDER — ALLOPURINOL 300 MG PO TABS: 150.0000 mg | ORAL_TABLET | Freq: Every day | ORAL | Status: AC

## 2015-08-03 MED ORDER — AMLODIPINE BESYLATE 5 MG PO TABS: 7.5000 mg | ORAL_TABLET | Freq: Every day | ORAL | Status: AC

## 2015-08-03 MED ORDER — TRIAMTERENE-HCTZ 75-50 MG PO TABS: ORAL_TABLET | ORAL | Status: AC

## 2015-08-03 MED ORDER — ESCITALOPRAM OXALATE 10 MG PO TABS: 10.0000 mg | ORAL_TABLET | Freq: Every day | ORAL | Status: AC

## 2015-08-03 NOTE — Patient Instructions (Addendum)
Consider lexapro taper in 05/2016. Recheck sugar in about 3-4 months, fasting visit.  Take care.  Glad to see you.

## 2015-08-03 NOTE — Progress Notes (Signed)
Pre visit review using our clinic review tool, if applicable. No additional management support is needed unless otherwise documented below in the visit note.  I have personally reviewed the Medicare Annual Wellness questionnaire and have noted 1. The patient's medical and social history 2. Their use of alcohol, tobacco or illicit drugs 3. Their current medications and supplements 4. The patient's functional ability including ADL's, fall risks, home safety risks and hearing or visual             impairment. 5. Diet and physical activities 6. Evidence for depression or mood disorders  The patients weight, height, BMI have been recorded in the chart and visual acuity is per eye clinic.  I have made referrals, counseling and provided education to the patient based review of the above and I have provided the pt with a written personalized care plan for preventive services.  Provider list updated- see scanned forms.  Routine anticipatory guidance given to patient.  See health maintenance.  Flu 2016  Shingles 2013 PNA 2015 Tetanus today.   Colon 2017 Breast cancer screening Prostate cancer screening Advance directive Cognitive function addressed- see scanned forms- and if abnormal then additional documentation follows.   Hypertension:    Using medication without problems or lightheadedness: yes Chest pain with exertion:no Edema:no Short of breath: not much- now at baseline, likely from COPD.  Uses O2 with cardio but o/w doing well.   Mild inc in sugar noted.  D/w pt.   Elevated Cholesterol: Using medications without problems:yes Muscle aches: not likely related to statin.  Diet compliance:yes Exercise:yes  Gout.  occ flares.  Worse with more manual work.  Some pain at the R 1st MCP.  He is getting better in the meantime.    Mood is better with lexapro.  The ear sx/spasm is still going on but the side effect is tolerable.  It would likely be possible to consider a taper in the fall  of this year or thereafter, d/w pt.  No SI/HI.    He has neck pain that improves with flexeril w/o ADE.   PMH and SH reviewed  Meds, vitals, and allergies reviewed.   ROS: See HPI.  Otherwise negative.    GEN: nad, alert and oriented HEENT: mucous membranes moist NECK: supple w/o LA CV: rrr. PULM: prolonged exp as expected but o/w ctab, no inc wob ABD: soft, +bs EXT: no edema SKIN: no acute rash Chronic R hand changes noted.

## 2015-08-04 DIAGNOSIS — Z0000 Encounter for general adult medical examination without abnormal findings: Secondary | ICD-10-CM | POA: Insufficient documentation

## 2015-08-04 DIAGNOSIS — R739 Hyperglycemia, unspecified: Secondary | ICD-10-CM | POA: Insufficient documentation

## 2015-08-04 NOTE — Assessment & Plan Note (Signed)
Mood is better with lexapro.  The ear sx/spasm is still going on but the side effect is tolerable.  It would likely be possible to consider a taper in the fall of this year or thereafter, d/w pt.  No SI/HI.

## 2015-08-04 NOTE — Assessment & Plan Note (Signed)
D/w pt.  He'll work on diet and exercise as tolerated and recheck later in 2017.  He agrees.

## 2015-08-04 NOTE — Assessment & Plan Note (Signed)
Controlled , no change in meds 

## 2015-08-04 NOTE — Assessment & Plan Note (Signed)
Flu 2016  Shingles 2013 PNA 2015 Tetanus today.   Colon 2017 Breast cancer screening Prostate cancer screening Advance directive Cognitive function addressed- see scanned forms- and if abnormal then additional documentation follows.

## 2015-08-04 NOTE — Assessment & Plan Note (Signed)
Uric acid at goal, continue as is. D/w pt. He agrees.

## 2015-08-04 NOTE — Assessment & Plan Note (Signed)
Controlled, continue as is.  

## 2015-11-12 ENCOUNTER — Ambulatory Visit: Payer: Medicare Other | Admitting: Emergency Medicine

## 2016-03-02 DIAGNOSIS — M79609 Pain in unspecified limb: Secondary | ICD-10-CM

## 2016-08-19 IMAGING — XA IR US GUIDE VASC ACCESS RIGHT
1 series · 1 of 1 positions shown · non-contrast
Comparison: none

CLINICAL DATA: Poor peripheral venous access, needs if the for
planned colonoscopy.

EXAM:
PICC PLACEMENT WITH ULTRASOUND AND FLUOROSCOPY
FLUOROSCOPY TIME:  3mGy, 18 seconds
TECHNIQUE: The procedure, risks (including but not limited to bleeding,
infection, organ damage ), benefits, and alternatives were explained
to the patient. Questions regarding the procedure were encouraged
and answered. The patient understands and consents to the procedure.
After written informed consent was obtained, patient was placed in
the supine position on angiographic table. Patency of the right
basilic vein was confirmed with ultrasound with image documentation.
An appropriate skin site was determined. Skin site was marked.
Region was prepped using maximum barrier technique including cap and
mask, sterile gown, sterile gloves, large sterile sheet, and
Chlorhexidine as cutaneous antisepsis. The region was infiltrated
locally with 1% lidocaine. Under real-time ultrasound guidance, the
right basilic vein was accessed with a 21 gauge micropuncture
needle; the needle tip within the vein was confirmed with ultrasound
image documentation. Needle exchanged over a 018 guidewire for a
peel-away sheath, through which a 5-French single-lumen power
injectable PICC trimmed to 39cm was advanced, positioned with its
tip near the cavoatrial junction. Spot chest radiograph confirms
appropriate catheter position. Catheter was flushed per protocol and
secured externally. The patient tolerated procedure well.
COMPLICATIONS:
COMPLICATIONS
none

[Series 300: line placements · 1 of 1 slices shown]
[im 1/1]
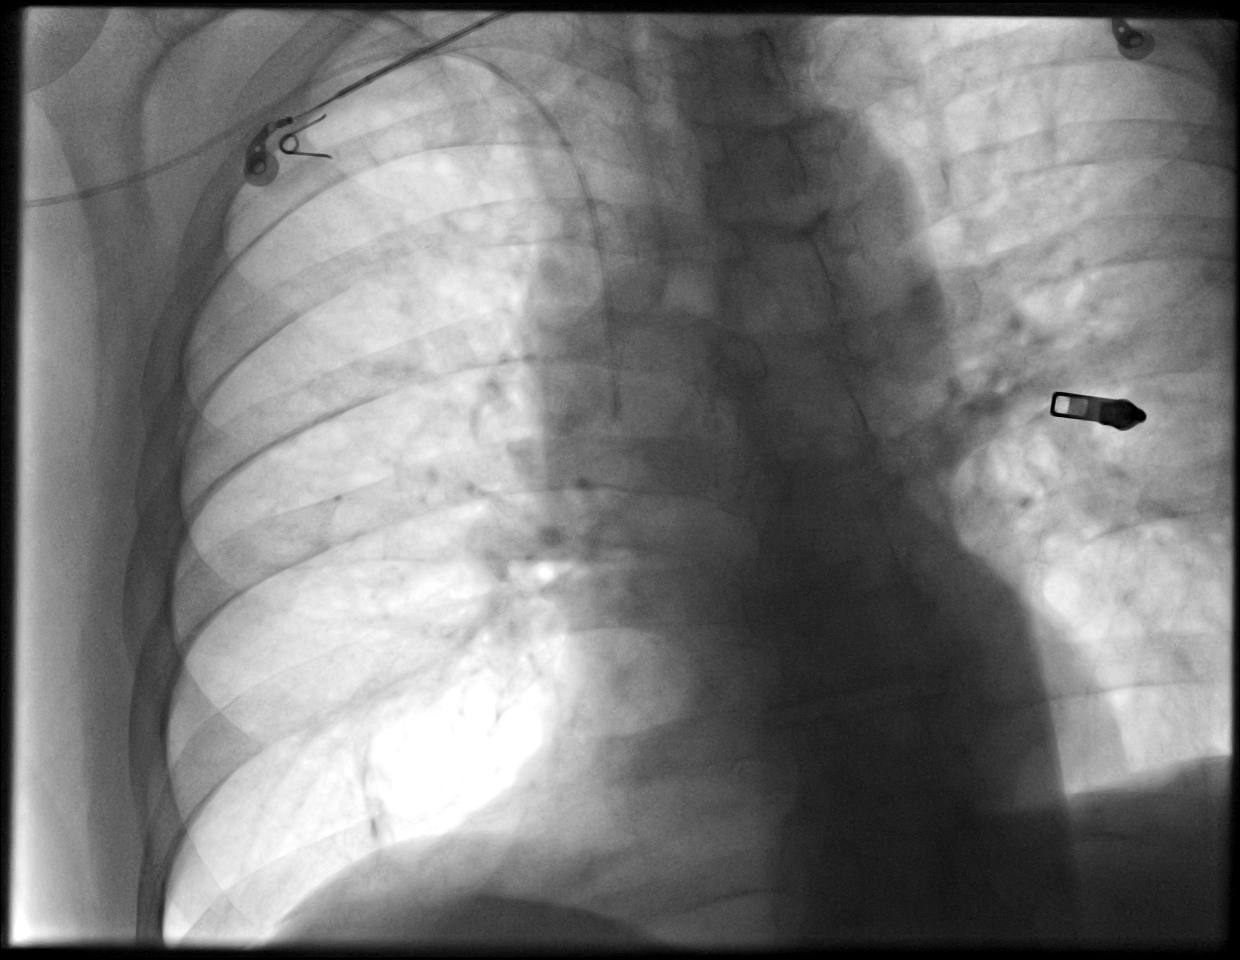

[1 of 1 positions shown; findings below may reference images not displayed]

IMPRESSION: 1. Technically successful five French single lumen power injectable
PICC placement

## 2016-08-19 IMAGING — US IR US GUIDE VASC ACCESS RIGHT
1 series · 2 of 2 positions shown · non-contrast
Comparison: none

CLINICAL DATA: Poor peripheral venous access, needs if the for
planned colonoscopy.

EXAM:
PICC PLACEMENT WITH ULTRASOUND AND FLUOROSCOPY
FLUOROSCOPY TIME:  3mGy, 18 seconds
TECHNIQUE: The procedure, risks (including but not limited to bleeding,
infection, organ damage ), benefits, and alternatives were explained
to the patient. Questions regarding the procedure were encouraged
and answered. The patient understands and consents to the procedure.
After written informed consent was obtained, patient was placed in
the supine position on angiographic table. Patency of the right
basilic vein was confirmed with ultrasound with image documentation.
An appropriate skin site was determined. Skin site was marked.
Region was prepped using maximum barrier technique including cap and
mask, sterile gown, sterile gloves, large sterile sheet, and
Chlorhexidine as cutaneous antisepsis. The region was infiltrated
locally with 1% lidocaine. Under real-time ultrasound guidance, the
right basilic vein was accessed with a 21 gauge micropuncture
needle; the needle tip within the vein was confirmed with ultrasound
image documentation. Needle exchanged over a 018 guidewire for a
peel-away sheath, through which a 5-French single-lumen power
injectable PICC trimmed to 39cm was advanced, positioned with its
tip near the cavoatrial junction. Spot chest radiograph confirms
appropriate catheter position. Catheter was flushed per protocol and
secured externally. The patient tolerated procedure well.
COMPLICATIONS:
COMPLICATIONS
none

[Series 1: ir fluoro/shunt/fist · 2 of 2 slices shown]
[im 1/2]
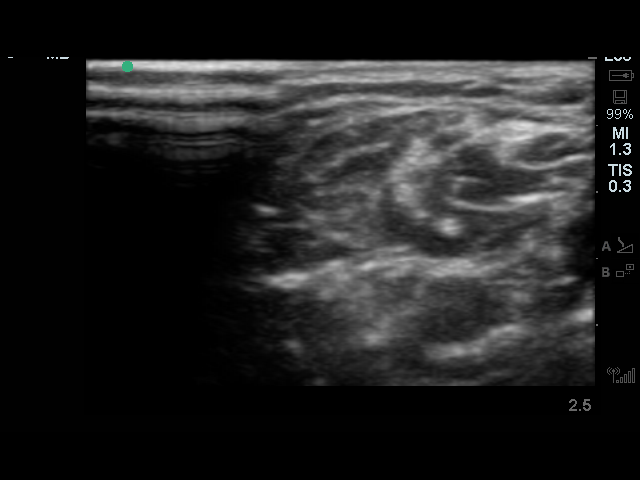
[im 2/2]
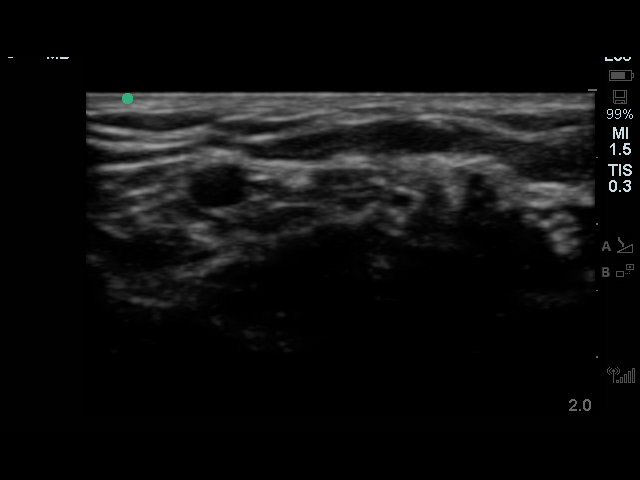

[2 of 2 positions shown; findings below may reference images not displayed]

IMPRESSION: 1. Technically successful five French single lumen power injectable
PICC placement

## 2016-09-02 ENCOUNTER — Other Ambulatory Visit: Payer: Self-pay | Admitting: Family Medicine

## 2016-09-02 DIAGNOSIS — K219 Gastro-esophageal reflux disease without esophagitis: Secondary | ICD-10-CM

## 2016-09-02 DIAGNOSIS — J441 Chronic obstructive pulmonary disease with (acute) exacerbation: Secondary | ICD-10-CM

## 2016-09-02 DIAGNOSIS — E785 Hyperlipidemia, unspecified: Secondary | ICD-10-CM

## 2016-09-02 DIAGNOSIS — M1710 Unilateral primary osteoarthritis, unspecified knee: Secondary | ICD-10-CM

## 2016-09-02 DIAGNOSIS — I10 Essential (primary) hypertension: Secondary | ICD-10-CM

## 2016-09-02 DIAGNOSIS — M545 Low back pain, unspecified: Secondary | ICD-10-CM

## 2016-09-02 DIAGNOSIS — IMO0002 Reserved for concepts with insufficient information to code with codable children: Secondary | ICD-10-CM

## 2016-09-02 DIAGNOSIS — N529 Male erectile dysfunction, unspecified: Secondary | ICD-10-CM

## 2016-09-02 DIAGNOSIS — N400 Enlarged prostate without lower urinary tract symptoms: Secondary | ICD-10-CM

## 2016-09-02 DIAGNOSIS — M109 Gout, unspecified: Secondary | ICD-10-CM

## 2016-09-02 DIAGNOSIS — J45909 Unspecified asthma, uncomplicated: Secondary | ICD-10-CM

## 2016-09-02 DIAGNOSIS — J309 Allergic rhinitis, unspecified: Secondary | ICD-10-CM

## 2016-09-02 NOTE — Telephone Encounter (Signed)
Last office visit:   08/03/2015  [facility] Last Filled:   Atenolol   45 tablet 3 08/03/2015  Last Filled:   Symbicort  30.6 g 4 08/03/2015  Last Filled:   Spiriva  90 capsule 3 08/03/2015  Please advise.

## 2016-09-04 NOTE — Telephone Encounter (Signed)
Please verify the pharmacy that the patient wants to use. Please schedule Medicare wellness visit. Then okay to send prescriptions. Thanks.

## 2017-01-26 ENCOUNTER — Other Ambulatory Visit: Payer: Self-pay | Admitting: Family Medicine

## 2017-01-26 NOTE — Telephone Encounter (Signed)
I thought patient moved.  Needs to come though new MD.  Thanks.

## 2017-01-26 NOTE — Telephone Encounter (Signed)
Electronic refill request. Nabumetone Last office visit:   08/03/15  No upcoming appts schduled. Last Filled:    180 tablet 3 08/03/2015  Please advise.

## 2017-01-30 ENCOUNTER — Telehealth: Payer: Self-pay | Admitting: Family Medicine

## 2017-01-30 NOTE — Telephone Encounter (Signed)
Left pt message asking to call Ebony Hail back directly at [phone] to schedule AWV + labs with Katha Cabal and CPE with PCP.  *NOTE* Last AWV 08/03/15

## 2017-02-27 NOTE — Telephone Encounter (Signed)
Attempted to reach pt again to schedule AWV.  # disconnected; unable to LVM

## 2018-12-19 ENCOUNTER — Encounter: Payer: Self-pay | Admitting: *Deleted

## 2019-10-01 ENCOUNTER — Telehealth: Payer: Self-pay | Admitting: Podiatry

## 2019-10-01 NOTE — Telephone Encounter (Signed)
Pt left message yesterday wanting to order a new pair of orthotics but now lives in [place]. He stated he last got them in 2017.  Upon looking in chart pt had his old orthotics refurbished in 2017 and there for we do not have a mold or cast of the original ones or where and when the originals where ordered. So we would have to remold or cast him to get new ones and to call if any questions.

## 2020-09-01 ENCOUNTER — Encounter: Payer: Self-pay | Admitting: Gastroenterology
